# Patient Record
Sex: Female | Born: 1965 | Race: White | Hispanic: No | Marital: Married | Smoking: Never smoker
Health system: Southern US, Community
[De-identification: ages and names within clinical notes are randomized; demographics above are authoritative.]

## PROBLEM LIST (undated history)

## (undated) DIAGNOSIS — B009 Herpesviral infection, unspecified: Secondary | ICD-10-CM

## (undated) HISTORY — PX: HX NO SURGICAL PROCEDURES: 2100001501

## (undated) HISTORY — DX: Herpesviral infection, unspecified: B00.9

---

## 1985-08-26 ENCOUNTER — Inpatient Hospital Stay
Admission: AD | Admit: 1985-08-26 | Disposition: A | Discharge status: Home / Self Care | Payer: Self-pay | Source: Ambulatory Visit

## 1990-12-04 ENCOUNTER — Ambulatory Visit: Admit: 1990-12-04 | Disposition: A | Discharge status: Home / Self Care | Payer: Self-pay | Source: Ambulatory Visit

## 1990-12-07 ENCOUNTER — Inpatient Hospital Stay
Admission: AD | Admit: 1990-12-07 | Disposition: A | Discharge status: Home / Self Care | Payer: Self-pay | Source: Ambulatory Visit

## 2001-08-05 ENCOUNTER — Emergency Department
Admission: EM | Admit: 2001-08-05 | Disposition: A | Discharge status: Home / Self Care | Payer: Self-pay | Source: Ambulatory Visit

## 2007-02-18 ENCOUNTER — Ambulatory Visit: Payer: Self-pay

## 2008-04-19 ENCOUNTER — Ambulatory Visit: Admission: RE | Admit: 2008-04-19 | Payer: Self-pay | Source: Ambulatory Visit | Admitting: Medical

## 2009-03-29 ENCOUNTER — Encounter (INDEPENDENT_AMBULATORY_CARE_PROVIDER_SITE_OTHER): Payer: 59 | Admitting: Geriatric Medicine

## 2010-05-10 ENCOUNTER — Encounter (INDEPENDENT_AMBULATORY_CARE_PROVIDER_SITE_OTHER): Payer: Self-pay | Admitting: Geriatric Medicine

## 2010-05-10 ENCOUNTER — Ambulatory Visit (INDEPENDENT_AMBULATORY_CARE_PROVIDER_SITE_OTHER): Payer: 59 | Admitting: Geriatric Medicine

## 2010-05-10 MED ORDER — ACYCLOVIR 400 MG TABLET
400.0000 mg | ORAL_TABLET | Freq: Two times a day (BID) | ORAL | Status: DC
Start: 2010-05-10 — End: 2011-05-24

## 2010-05-10 NOTE — Patient Instructions (Signed)
Arterial Hypertension, High Blood Pressure   Arterial hypertension (high blood pressure) is a condition of elevated pressure in your blood vessels. Hypertension over a long period of time is a risk factor for strokes, heart attacks and heart failure. It is also the leading cause of kidney (renal) failure.   CAUSES   In Adults -- Over 90% of all hypertension has no known cause. This is called essential/primary hypertension. In the other 10% of people with hypertension, the increase in blood pressure is caused by another disorder. This is called secondary hypertension. IMPORTANT CAUSES OF SECONDARY HYPERTENSION ARE:   Heavy alcohol use.   Obstructive sleep apnea.   Hyperaldosterosim (Conn's syndrome).   Steroid use.   Chronic kidney failure.   Hyperparathyroidism.   Medications.  Renal artery stenosis.   Pheochromocytoma.   Cushing's disease.   Coarcataion of the aorta.   Scleroderma renal crisis.   Licorice (in excessive amounts).   Drugs (cocaine, methamphetamine).    Your caregiver can explain any items above that apply to you.   In Children -- Secondary hypertension is more common and should always be considered.   Pregnancy -- Few women of childbearing age have high blood pressure. However, up to 10% of them develop hypertension of pregnancy. Generally, this will not harm (benign) the woman. It may be a sign of 3 complications of pregnancy: pre-eclampsia, HELLP syndrome, and eclampsia. Follow-up and control with medication is necessary.   SYMPTOMS   This condition normally does not produce any noticeable symptoms. It is usually found during a routine exam.   Malignant hypertension is a late problem of high blood pressure. It may have the following symptoms:   Headaches.   Blurred vision.   End-organ damage (this means your kidneys, heart, lungs and other organs are being damaged).   Stressful situations can increase the blood pressure. If a person with normal blood pressure has their blood pressure go up while  being seen by their caregiver, this is often termed "white coat hypertension." Its importance is not known. It may be related with eventually developing hypertension or complications of hypertension.   Hypertension is often confused with mental tension, stress and anxiety.   DIAGNOSIS   The diagnosis is made by 3 separate blood pressure measurements. They are taken at least 1 week apart from each other. If there is organ damage from hypertension, the diagnosis may be made without repeat measurements.   Hypertension is usually identified by having blood pressure readings:   Above 140/90 mmHg measured in both arms, at 3 separate times, over a couple weeks.   Over 130/80 mmHg should be considered a risk factor and may require treatment in patients with diabetes.   Blood pressure readings over 120/80 mmHg are called "pre-hypertension" even in non-diabetic patients.   To get a true blood pressure measurement, use the following guidelines. Be aware of the factors that can alter blood pressure readings.   Take measurements at least 1 hour after caffeine.   Take measurements 30 minutes after smoking and without any stress. This is another reason to quit smoking - it raises your blood pressure.   Use a proper cuff size. Ask your caregiver if you are not sure about your cuff size.   Most home blood pressure cuffs are automatic. They will measure systolic and diastolic pressures. The systolic pressure is the pressure reading at the start of sounds. Diastolic pressure is the pressure at which the sounds disappear. If you are elderly, measure pressures in   multiple postures. Try sitting, lying or standing.   Sit at rest for a minimum of 5 minutes before taking measurements.   You should not be on any medications like decongestants. These are found in many cold medications.   Record your blood pressure readings and review them with your caregiver.   If you have hypertension:   Your caregiver may do tests to be sure you do not have  secondary hypertension (see "causes" above).   Your caregiver may also look for signs of the Metabolic Syndrome. This is also called Syndrome X or Insulin Resistance Syndrome. You may have this syndrome if you have type 2 diabetes, abdominal obesity, and abnormal blood lipids in addition to hypertension.   Your caregiver will take your medical and family history and perform a physical exam.   Diagnostic tests may include blood tests (for glucose, cholesterol, potassium, and kidney function), a urinalysis, or an EKG. Other tests may also be necessary depending on your condition.   PREVENTION   There are important lifestyle issues that you can adopt to reduce your chance of developing hypertension:   Maintain a normal weight.   Limit the amount of salt (sodium) in your diet.   Exercise often.   Limit alcohol intake.   Get enough potassium in your diet. Discuss specific advice with your caregiver.   Follow a DASH diet (dietary approaches to stop hypertension). This diet is rich in fruits, vegetables, and low-fat dairy products, and avoids certain fats.   PROGNOSIS   Essential hypertension cannot be cured. Lifestyle changes and medical treatment can lower blood pressure and reduce complications. The prognosis of secondary hypertension depends on the underlying cause. Many people whose hypertension is controlled with medicine or lifestyle changes can live a normal, healthy life.   RISKS AND COMPLICATIONS   While high blood pressure alone is not an illness, it often requires treatment due to its short- and long-term effects on many organs. Hypertension increases your risk for:   CVA's or strokes (Cerebrovascular accident).   Heart failure due to chronically high blood pressure (Hypertensive cardiomyopathy).  Heart attack (Myocardial infarction).   Damage to the retina (Hypertensive retinopathy).   Kidney failure (Hypertensive nephropathy).    Your caregiver can explain list items above that apply to you. Treatment of  hypertension can significantly reduce the risk of complications.   TREATMENT   For overweight patients, weight loss and regular exercise is recommended. Physical fitness lowers blood pressure.   Mild hypertension is usually treated with diet and exercise. A diet rich in fruits and vegetables, fat-free dairy and foods low in fat and salt (sodium) can help lower blood pressure. Decreasing salt intake decreases blood pressure in a 1/3 of people.   Stop smoking if you are a smoker.   The steps above are highly effective in reducing blood pressure. While these actions are easy to suggest, they are difficult to achieve. Most patients with moderate or severe hypertension end up requiring medications to bring their blood pressure down to a normal level. There are several classes of medications for treatment. Blood pressure pills (anti-hypertensives) will lower blood pressure by their different actions. Lowering the blood pressure by 10 mmHg may decrease the risk of complications by as much as 25%.   The goal of treatment is effective blood pressure control. This will reduce your risk for complications. Your caregiver will help you determine the best treatment for you according to your lifestyle. What is excellent treatment for one person, may not   be for you.   HOME CARE INSTRUCTIONS   DO NOT smoke.   Follow the lifestyle changes outlined in the "Prevention" section.   If you are on medications, follow the directions carefully. Blood pressure medications must be taken as prescribed. Skipping doses reduces their benefit. It also puts you at risk for problems.   Follow up with your caregiver, as directed.   If you are asked to monitor your blood pressure at home, follow the guidelines in the "Diagnosis" section above.   SEEK MEDICAL CARE IF:   You think you are having medication side effects.   You have recurrent headaches or lightheadedness.   You have swelling in your ankles.   You have trouble with your vision.   SEEK  IMMEDIATE MEDICAL CARE IF:   You have sudden onset of chest pain or pressure, difficulty breathing, or other symptoms of a heart attack.   You have a severe headache.   You have symptoms of a stroke (sudden weakness, difficulty speaking, difficulty walking).   MAKE SURE YOU:   Understand these instructions.   Will watch your condition.   Will get help right away if you are not doing well or get worse.   Information courtesy of the CDC & NIH.   Document Released: 01/15/2005 Document Re-Released: 12/29/2007   [employer] Patient Information 2011 [employer].

## 2010-05-10 NOTE — Progress Notes (Signed)
Subjective:     Patient ID:  Marie Gutierrez is an 45 y.o. female     Chief Complaint:  Chief Complaint   Patient presents with   . Medication Refill     acyclovir         The history is provided by the patient. No language interpreter was used.   she is here for med refill, she has hx of HSV tpe2. She takes Zovirax 400 mg BID as prophylaxy. She had only one episode of rash since last visit.  She never been Dxed with HTN. She denied exertional SOB/CP/dizziness.  Past Medical History   Diagnosis Date   . HSV-2 infection        Review of Systems   Constitutional: Negative.    HENT: Negative.    Respiratory: Negative.    Cardiovascular: Negative.    Genitourinary: Negative.    Neurological: Negative.        Objective:     Physical Exam   Nursing note and vitals reviewed.  Constitutional: She is oriented to person, place, and time and well-developed, well-nourished, and in no distress.   HENT:   Head: Normocephalic and atraumatic.   Neck: Normal range of motion. Neck supple. Carotid bruit is not present. No thyromegaly present.   Cardiovascular: Normal rate, normal heart sounds and intact distal pulses.    Pulmonary/Chest: Effort normal and breath sounds normal.   Abdominal: Soft. Bowel sounds are normal. She exhibits no distension and no mass. No tenderness. She has no rebound and no guarding.   Musculoskeletal: She exhibits no edema.   Lymphadenopathy:     She has no cervical adenopathy.   Neurological: She is alert and oriented to person, place, and time. Gait normal. GCS score is 15.         Ortho/Musculoskeletal:   She exhibits no edema.       Assessment & Plan:     1. 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 Elevated blood pressure (796.2S)  Send me home bp log  HTN hans out was given.

## 2010-09-07 ENCOUNTER — Encounter (INDEPENDENT_AMBULATORY_CARE_PROVIDER_SITE_OTHER): Payer: Self-pay | Admitting: Geriatric Medicine

## 2010-09-07 ENCOUNTER — Ambulatory Visit (INDEPENDENT_AMBULATORY_CARE_PROVIDER_SITE_OTHER): Payer: 59 | Admitting: Geriatric Medicine

## 2010-09-07 MED ORDER — OMEPRAZOLE 40 MG CAPSULE,DELAYED RELEASE
40.0000 mg | DELAYED_RELEASE_CAPSULE | Freq: Every day | ORAL | Status: DC
Start: 2010-09-07 — End: 2011-05-24

## 2010-09-07 MED ORDER — ALBUTEROL SULFATE HFA 90 MCG/ACTUATION AEROSOL INHALER
1.00 | INHALATION_SPRAY | Freq: Four times a day (QID) | RESPIRATORY_TRACT | Status: DC | PRN
Start: 2010-09-07 — End: 2011-05-24

## 2010-09-07 NOTE — Progress Notes (Signed)
Subjective:     Patient ID:  Marie Gutierrez is an 45 y.o. female     Chief Complaint:    Chief Complaint   Patient presents with   . Other     pt states pressure in chest and throat, pressure leaves if she burps x 2 weeks   . Dizziness     today, 1 episode was talking on phone and became dizzy last 20 seconds or so       Patient is a 45 y.o. female presenting with chest pain. The history is provided by the patient. No language interpreter was used.   Chest Pain   This is a new problem. Episode onset: 2 weeks. The onset quality is gradual. The problem occurs constantly. The problem has been unchanged. The pain is present in the substernal region. The pain is mild. The quality of the pain is described as pressure. The pain does not radiate. Associated symptoms include dizziness (just one short episode for 20 sec today, not dizzy now.). Pertinent negatives include no abdominal pain, back pain, claudication, cough, diaphoresis, exertional chest pressure, fever, headaches, hemoptysis, irregular heartbeat, leg pain, lower extremity edema, malaise/fatigue, nausea, near-syncope, numbness, orthopnea, palpitations, PND, shortness of breath, sputum production, syncope, vomiting or weakness. The pain is aggravated by nothing. Treatments tried: sprites. The treatment provided moderate relief.   Pertinent negatives for past medical history include no seizures.     Past Medical History   Diagnosis Date   . HSV-2 infection      History reviewed. No pertinent past surgical history.      Review of Systems   : 1   Constitutional: Negative for fever, malaise/fatigue and diaphoresis.   HENT: Negative.  Negative for hearing loss, ear pain, congestion, sore throat, tinnitus and ear discharge.    Eyes: Negative.    Respiratory: Negative for cough, hemoptysis, sputum production and shortness of breath.    Cardiovascular: Positive for chest pain. Negative for palpitations, orthopnea, claudication, syncope, PND and near-syncope.    Gastrointestinal: Negative for nausea, vomiting and abdominal pain.   Musculoskeletal: Negative for back pain.   Neurological: Positive for dizziness (just one short episode for 20 sec today, not dizzy now.). Negative for tingling, tremors, sensory change, speech change, focal weakness, seizures, loss of consciousness, weakness, numbness and headaches.       Objective:     Physical Exam   [nursing notereviewed.  Constitutional: She is oriented to person, place, and time and well-developed, well-nourished, and in no distress.   HENT:   Head: Normocephalic and atraumatic.   Eyes: Conjunctivae and EOM are normal. Pupils are equal, round, and reactive to light.   Neck: Normal range of motion. Neck supple. Carotid bruit is not present. No thyromegaly present.   Cardiovascular: Normal rate, regular rhythm, normal heart sounds and intact distal pulses.    Pulmonary/Chest: Effort normal. She has wheezes.   Abdominal: Soft. Bowel sounds are normal. She exhibits no distension and no mass. There is no tenderness. There is no rebound and no guarding.   Lymphadenopathy:     She has no cervical adenopathy.   Neurological: She is alert and oriented to person, place, and time. She has normal motor skills, normal sensation, normal strength, normal reflexes and intact cranial nerves. She has a normal Romberg Test. Gait normal. GCS score is 15.       .    Assessment & Plan:     1. Atypical chest pain (786.59)  2. Heartburn (787.1)  Omeprazole (PRILOSEC) 40 mg Oral Capsule, Delayed Release(E.C.)   3. Wheeze (786.07)  albuterol sulfate (PROVENTIL OR VENTOLIN) 90 mcg/actuation Inhalation HFA Aerosol Inhaler

## 2010-12-19 ENCOUNTER — Encounter (INDEPENDENT_AMBULATORY_CARE_PROVIDER_SITE_OTHER): Payer: 59 | Admitting: Geriatric Medicine

## 2011-05-24 ENCOUNTER — Encounter (INDEPENDENT_AMBULATORY_CARE_PROVIDER_SITE_OTHER): Payer: Self-pay | Admitting: Geriatric Medicine

## 2011-05-24 ENCOUNTER — Ambulatory Visit (INDEPENDENT_AMBULATORY_CARE_PROVIDER_SITE_OTHER): Payer: 59 | Admitting: Geriatric Medicine

## 2011-05-24 VITALS — BP 110/82 | HR 74 | Temp 98.4°F | Resp 12 | Ht 62.0 in | Wt 127.0 lb

## 2011-05-24 MED ORDER — ACYCLOVIR 400 MG TABLET
400.0000 mg | ORAL_TABLET | Freq: Two times a day (BID) | ORAL | Status: DC
Start: 2011-05-24 — End: 2012-04-18

## 2011-05-24 NOTE — Progress Notes (Signed)
 Subjective:     Patient ID:  Marie Gutierrez is an 46 y.o. female     Chief Complaint:    Chief Complaint   Patient presents with   . Medication Refill     no complaints       The history is provided by the patient. No language interpreter was used.     she is here for med refill, she has hx of HSV tpe2. She takes Zovirax  400 mg BID as prophylaxy. She hadno episode of rash since last visit.    She denied exertional SOB/CP/dizziness.  No Known Allergies      Review of Systems   Constitutional: Negative.    HENT: Negative.    Cardiovascular: Negative.    Gastrointestinal: Negative.    Skin: Negative.    Neurological: Negative.        Objective:     Filed Vitals:    05/24/11 1741 05/24/11 1802   BP: 128/89 110/82   Pulse: 74    Temp: 36.9 C (98.4 F)    TempSrc: Oral    Resp: 12    Height: 1.575 m (5' 2)    Weight: 57.607 kg (127 lb)    SpO2: 100%        Physical Exam   Constitutional: She appears well-developed and well-nourished.   HENT:   Head: Normocephalic and atraumatic.   Neck: Normal range of motion. Neck supple. No thyromegaly present.   Cardiovascular: Normal rate, regular rhythm, normal heart sounds and intact distal pulses.    Pulm:  Effort normal and breath sounds normal.    Abdomen:   Bowel sounds are normal. She exhibits no distension and no mass. Soft. No tenderness. She has no rebound and no guarding.   Ortho/Musculoskeletal:   She exhibits no edema.   Lymphadenopathy:     She has no cervical adenopathy.   Nursing note and vitals reviewed.      .    Assessment & Plan:     1. HSV-2 infection (054.9)  acyclovir  (ZOVIRAX ) 400 mg Oral Tablet   RTC in one year

## 2012-04-18 ENCOUNTER — Ambulatory Visit: Payer: 59 | Admitting: Geriatric Medicine

## 2012-04-18 ENCOUNTER — Encounter: Payer: Self-pay | Admitting: Geriatric Medicine

## 2012-04-18 VITALS — BP 118/68 | HR 88 | Temp 98.7°F | Resp 12 | Ht 63.0 in | Wt 129.0 lb

## 2012-04-18 MED ORDER — ACYCLOVIR 400 MG TABLET
400.0000 mg | ORAL_TABLET | Freq: Two times a day (BID) | ORAL | Status: DC
Start: 2012-04-18 — End: 2012-07-13

## 2012-04-18 NOTE — Progress Notes (Signed)
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

## 2012-04-18 NOTE — Progress Notes (Signed)
Subjective:     Patient ID:  Marie Gutierrez is an 47 y.o. female     Chief Complaint:    Chief Complaint   Patient presents with   . Medication Refill     acyclovir        The history is provided by the patient. No language interpreter was used.   she is here for med refill , she has hx of HSVtype 2.  she is here for med refill, she has hx of HSV tpe2. She takes Zovirax 400 mg BID as prophylaxy. She hadno episode of rash since last visit.   She denied exertional SOB/CP/dizziness.  She would like to be checked for HPL and DM.  No Known Allergies   Past Medical History   Diagnosis Date   . HSV-2 infection      History reviewed. No pertinent past surgical history.  Family History   Problem Relation Age of Onset   . Diabetes General Family Hx      History     Social History   . Marital Status: Divorced     Spouse Name: N/A     Number of Children: N/A   . Years of Education: N/A     Social History Main Topics   . Smoking status: Never Smoker    . Smokeless tobacco: Never Used   . Alcohol Use: No   . Drug Use: Not on file   . Sexually Active: Not on file     Other Topics Concern   . Not on file     Social History Narrative   . No narrative on file     Expanded Substance History     Additional history       ROS  Constitutional: Negative.   HENT: Negative.   Cardiovascular: Negative.   Gastrointestinal: Negative.   Skin: Negative.   Neurological: Negative.  Objective:   BP 118/68   Pulse 88   Temp(Src) 37.1 C (98.7 F) (Tympanic)   Resp 12   Ht 1.6 m (5\' 3" )   Wt 58.514 kg (129 lb)   BMI 22.86 kg/m2   SpO2 99%   LMP 02/13/2012    Physical Exam   Constitutional: She appears well-developed and well-nourished.   HENT:   Head: Normocephalic and atraumatic.   Neck: Normal range of motion. Neck supple. No thyromegaly present.   Cardiovascular: Normal rate, regular rhythm and normal heart sounds.    Pulm:  Effort normal and breath sounds normal.    Abdomen:    Bowel sounds are normal. She exhibits no distension. Soft. No tenderness. She has no rebound and no guarding.   Ortho/Musculoskeletal:   She exhibits no edema.   Nursing note and vitals reviewed.      .    Assessment & Plan:       ICD-9-CM    1. HSV-2 infection 054.9 acyclovir (ZOVIRAX) 400 mg Oral Tablet  Stable as long as taling Zovirax   2. Screening for lipoid disorders V77.91 LIPID PANEL        3. Screening for diabetes mellitus V77.1 GLUCOSE - [facility] ONLY   4. Laboratory examination ordered as part of a routine general medical examination V72.62 LIPID PANEL     GLUCOSE - [facility] ONLY        5.      6. Encounter for long-term (current) use of other medications V58.69

## 2012-04-20 ENCOUNTER — Encounter: Payer: Self-pay | Admitting: Geriatric Medicine

## 2012-06-12 ENCOUNTER — Encounter: Payer: Self-pay | Admitting: Geriatric Medicine

## 2012-06-12 ENCOUNTER — Ambulatory Visit: Payer: 59 | Admitting: Geriatric Medicine

## 2012-06-12 VITALS — BP 115/70 | HR 101 | Temp 98.0°F | Resp 14 | Ht 63.0 in | Wt 121.0 lb

## 2012-06-12 MED ORDER — BISMUTH SUBSALICYLATE 262 MG/15 ML ORAL SUSPENSION
30.00 mL | Freq: Four times a day (QID) | ORAL | Status: DC | PRN
Start: 2012-06-12 — End: 2012-07-13

## 2012-06-12 MED ORDER — ONDANSETRON HCL 4 MG TABLET
4.00 mg | ORAL_TABLET | Freq: Three times a day (TID) | ORAL | Status: DC | PRN
Start: 2012-06-12 — End: 2012-07-13

## 2012-06-12 NOTE — Progress Notes (Signed)
Subjective:     Patient ID:  Marie Gutierrez is an 47 y.o. female     Chief Complaint:    Chief Complaint   Patient presents with   . Ear Pain       Patient is a 47 y.o. female presenting with ear pain. The history is provided by the patient.   Ear Pain   There is pain in the right ear. This is a new problem. The current episode started yesterday. The problem occurs constantly. The problem has been gradually worsening. Maximum temperature: subjective fever. The pain is at a severity of 4/10. Associated symptoms include abdominal pain and headaches. Pertinent negatives include no coughing, diarrhea (resolved), drainage, ear discharge, hearing loss, neck pain, rash, rhinorrhea, sore throat or vomiting (resolved). Associated symptoms comments: Started out with N/V/D non bloody 3-4 days ago, now has resolved, but rt ear and face pain started yesterday.  . She has tried nothing for the symptoms. There is no history of a chronic ear infection, hearing loss or a tympanostomy tube.       Past Medical History  Current Outpatient Prescriptions   Medication Sig   . acyclovir (ZOVIRAX) 400 mg Oral Tablet Take 1 Tab (400 mg total) by mouth Twice daily     No Known Allergies  Past Medical History   Diagnosis Date   . HSV-2 infection      No past surgical history on file.  Family History   Problem Relation Age of Onset   . Diabetes       History     Social History   . Marital Status: Divorced     Spouse Name: N/A     Number of Children: N/A   . Years of Education: N/A     Social History Main Topics   . Smoking status: Never Smoker    . Smokeless tobacco: Never Used   . Alcohol Use: No   . Drug Use: Not on file   . Sexually Active: Not on file     Other Topics Concern   . Not on file     Social History Narrative   . No narrative on file         Review of Systems   Constitutional: Positive for fever, weight loss (6 LBs over past few days) and malaise/fatigue.    HENT: Positive for ear pain. Negative for hearing loss, nosebleeds, congestion, sore throat, rhinorrhea, neck pain and ear discharge.    Respiratory: Negative.  Negative for cough and stridor.    Cardiovascular: Negative.    Gastrointestinal: Positive for nausea and abdominal pain. Negative for vomiting (resolved) and diarrhea (resolved).   Skin: Negative for rash.   Neurological: Positive for headaches.       Objective:     BP 115/70   Pulse 101   Temp(Src) 36.7 C (98 F) (Tympanic)   Resp 14   Ht 1.6 m (5\' 3" )   Wt 54.885 kg (121 lb)   BMI 21.44 kg/m2   SpO2 98%   LMP 04/18/2012     Physical Exam   Constitutional: She is oriented to person, place, and time. She appears well-developed and well-nourished.   HENT:   Head: Normocephalic and atraumatic.   Right Ear: Hearing, tympanic membrane, external ear and ear canal normal.   Left Ear: Hearing, tympanic membrane, external ear and ear canal normal.   Nose: Nose normal.   Mouth/Throat: Uvula is midline. Mucous membranes are dry. No oropharyngeal exudate, posterior  oropharyngeal edema or posterior oropharyngeal erythema.   Neck: Normal range of motion. Neck supple. No thyromegaly present.   Pulm:  Effort normal.    Ortho/Musculoskeletal:   She exhibits no edema.   Lymphadenopathy:        Head (right side): No submental, no submandibular, no tonsillar, no preauricular, no posterior auricular and no occipital adenopathy present.        Head (left side): No submental, no submandibular, no tonsillar, no preauricular, no posterior auricular and no occipital adenopathy present.     She has no cervical adenopathy.   Neurological: She is alert and oriented to person, place, and time.   Nursing note and vitals reviewed.        .    Assessment & Plan:       ICD-9-CM    1. Right ear pain 388.70 RETURN TO WORK/SCHOOL  Tylenol PRN   2. Viral syndrome 079.99 ondansetron (ZOFRAN) 4 mg Oral Tablet     bismuth subsalicylate (PEPTO-BISMOL) 262 mg/15 mL Oral Suspension    The 'BRAT' diet is suggested, then progress to diet as tolerated as symptoms abate. Call if bloody stools, persistent diarrhea, vomiting, fever or abdominal pain.  Push fluid

## 2012-06-12 NOTE — Patient Instructions (Signed)
The 'BRAT' diet is suggested, then progress to diet as tolerated as symptoms abate. Call if bloody stools, persistent diarrhea, vomiting, fever or abdominal pain.

## 2012-07-13 ENCOUNTER — Encounter (INDEPENDENT_AMBULATORY_CARE_PROVIDER_SITE_OTHER): Payer: Self-pay | Admitting: Family Medicine

## 2012-07-13 ENCOUNTER — Ambulatory Visit (INDEPENDENT_AMBULATORY_CARE_PROVIDER_SITE_OTHER): Payer: 59 | Admitting: Family Medicine

## 2012-07-13 VITALS — BP 137/84 | HR 132 | Temp 101.6°F | Resp 16 | Ht 63.6 in | Wt 121.4 lb

## 2012-07-13 DIAGNOSIS — A084 Viral intestinal infection, unspecified: Secondary | ICD-10-CM

## 2012-07-13 DIAGNOSIS — R509 Fever, unspecified: Secondary | ICD-10-CM

## 2012-07-13 MED ORDER — ONDANSETRON 4 MG DISINTEGRATING TABLET
4.0000 mg | ORAL_TABLET | Freq: Once | ORAL | Status: AC
Start: 2012-07-13 — End: 2012-07-13

## 2012-07-13 NOTE — Progress Notes (Deleted)
Subjective:     Marie Gutierrez is a 47 y.o. female here for fevers, vomiting.        Review of Systems: All others negative    Objective:     Vitals: BP 137/84   Pulse 132   Temp(Src) 38.7 C (101.6 F) (Oral)   Resp 16   Ht 1.615 m (5' 3.6")   Wt 55.067 kg (121 lb 6.4 oz)   BMI 21.11 kg/m2  General: {GENERAL :20944}  Lungs: {Lung:20948}  Cardiovascular: {Cardiovascular:20950}  Abdomen: {Gastrointestinal:20954}  Extremities: {Extremities:20958}      Assessment/Plan:   No diagnosis found.    follow up ***        Other medical issues include:  Patient Active Problem List   Diagnosis   . History of herpes genitalis   . Viral syndrome       Family History   Problem Relation Age of Onset   . Hypertension Father      No Known Allergies  Outpatient Prescriptions Marked as Taking for the 07/13/12 encounter (Office Visit) with Marcie Mowers, MD   Medication Sig   . acyclovir (ZOVIRAX) 200 mg Oral Capsule Take 200 mg by mouth Five times a day       Burna Cash, MD 07/13/2012, 6:04 PM

## 2012-07-13 NOTE — Progress Notes (Signed)
BP 137/84   Pulse 132   Temp(Src) 38.7 C (101.6 F) (Oral)   Resp 16   Ht 1.615 m (5' 3.6")   Wt 55.067 kg (121 lb 6.4 oz)   BMI 21.11 kg/m2  Marcy Siren, RTR 07/13/2012, 5:55 PM

## 2012-07-13 NOTE — Progress Notes (Addendum)
Subjective:     Marie Gutierrez is a 47 y.o. female here for vomiting.  Yesterday morning she started to feel nauseous and chilled, and last night started vomiting.  Has vomited several times since then and hasn't really been able to keep any food or liquids down.  Also today is feeling like she has a fever.  No diarrhea or abdominal pain.  No blood in vomit.  Was feeling well until yesterday but had a similar illness about a month ago.      Review of Systems: All others negative    Objective:     Vitals: BP 137/84   Pulse 132   Temp(Src) 38.7 C (101.6 F) (Oral)   Resp 16   Ht 1.615 m (5' 3.6")   Wt 55.067 kg (121 lb 6.4 oz)   BMI 21.11 kg/m2  General: appears mildly to moderately ill but not in distress  HEENT: MMM at this time and pink, TMs clear, no adenopathy  Lungs: clear to auscultation bilaterally.   Cardiovascular:    Heart tachycardic, no murmur  Abdomen: soft, non-tender, bowel sounds normal, non-distended and no hepatosplenomegaly  Extremities: no cyanosis or edema      Assessment/Plan:       ICD-9-CM   1. Viral gastroenteritis 008.8   2. Fever and chills 780.60   tried Zofran odt here and did well with it so called in Rx to [facility]  Encouraged to push fluids and to report to ED if abdominal pain develops or she feels worse in any way, or if she can't hold down fluids since she is febrile and tachycardic    follow up with PCP this week        Other medical issues include:  Patient Active Problem List   Diagnosis   . History of herpes genitalis   . Viral syndrome       Family History   Problem Relation Age of Onset   . Hypertension Father      No Known Allergies  Outpatient Prescriptions Marked as Taking for the 07/13/12 encounter (Office Visit) with Marcie Mowers, MD   Medication Sig   . acyclovir (ZOVIRAX) 200 mg Oral Capsule Take 200 mg by mouth Five times a day       Burna Cash, MD 07/13/2012, 6:02 PM       I discussed the patient with the resident.  I reviewed the resident's note.  I agree with the findings and plan of care as documented in the resident's note.  Any exceptions/additions are edited/noted.    Marcie Mowers, MD 07/14/2012, 3:19 PM

## 2012-07-13 NOTE — Progress Notes (Signed)
07/13/12 1800   Nursing Procedures / Med Admin   Medications- other Zofran 4mg  ODT   Medication Dose 4mg    Route of Administration SL   Time of Administration 1820   [facility] # [id number]   LOT # [id number]   Expiration date 03/29/13   Manufacturer [employer]   Clinic Supplied Yes   Comments: id and allergies confirmed prior to admin   Initials tlh

## 2012-07-14 ENCOUNTER — Encounter (INDEPENDENT_AMBULATORY_CARE_PROVIDER_SITE_OTHER): Payer: Self-pay

## 2012-07-14 NOTE — Progress Notes (Unsigned)
Spoke to daugther, pt feeling better, told to have pt call us for any questions or concerns.

## 2012-07-18 ENCOUNTER — Encounter: Payer: Self-pay | Admitting: Geriatric Medicine

## 2012-07-18 ENCOUNTER — Ambulatory Visit: Payer: 59 | Admitting: Geriatric Medicine

## 2012-07-18 VITALS — BP 110/68 | HR 107 | Temp 98.0°F | Resp 14 | Ht 63.0 in | Wt 122.0 lb

## 2012-07-18 DIAGNOSIS — N3090 Cystitis, unspecified without hematuria: Secondary | ICD-10-CM | POA: Insufficient documentation

## 2012-07-18 LAB — POCT URINE DIPSTICK
BILIRUBIN: NEGATIVE
GLUCOSE: NEGATIVE
KETONE: NEGATIVE
NITRITE: NEGATIVE
PH: 5
PROTEIN: NEGATIVE
SPECIFIC GRAVITY: 5
UROBILINOGEN: NEGATIVE

## 2012-07-18 MED ORDER — SULFAMETHOXAZOLE 800 MG-TRIMETHOPRIM 160 MG TABLET
1.00 | ORAL_TABLET | Freq: Two times a day (BID) | ORAL | Status: DC
Start: 2012-07-18 — End: 2013-01-16

## 2012-07-18 NOTE — Progress Notes (Signed)
Patient is here for a UTI, frequency in urination, small blood on toilet paper Bartholome Bill, CMA

## 2012-07-18 NOTE — Progress Notes (Signed)
Subjective:     Patient ID:  Marie Gutierrez is an 47 y.o. female     Chief Complaint:    Chief Complaint   Patient presents with   . Urinary Tract Infection       Patient is a 47 y.o. female presenting with frequency.   Urinary Frequency  This is a new problem. Episode onset: 5-6 days ago, with viral symptoms with nausea and myalgia, she went to Urgent care, Zofran was given, she did not mentioned them about  frequency. The problem occurs constantly. The problem has been gradually worsening. Associated symptoms comments: She c/o urgency and lower abd pressure.she feels tired with body ache.. Nothing aggravates the symptoms. Nothing relieves the symptoms. She has tried acetaminophen for the symptoms. The treatment provided no relief.     Last UTI was 2-3 years ago  Past Medical History  Current Outpatient Prescriptions   Medication Sig   . acyclovir (ZOVIRAX) 200 mg Oral Capsule Take 200 mg by mouth Five times a day     No Known Allergies  Past Medical History   Diagnosis Date   . HSV-2 infection      Past Surgical History   Procedure Laterality Date   . Hx no surgical procedures       Family History   Problem Relation Age of Onset   . Hypertension Father      History     Social History   . Marital Status: Divorced     Spouse Name: N/A     Number of Children: N/A   . Years of Education: N/A     Occupational History   .       Social History Main Topics   . Smoking status: Never Smoker    . Smokeless tobacco: Never Used   . Alcohol Use: No   . Drug Use: Not on file   . Sexually Active: Not on file     Other Topics Concern   . Not on file     Social History Narrative   . No narrative on file         Review of Systems   Constitutional: Positive for malaise/fatigue.   Respiratory: Negative.    Cardiovascular: Negative.    Gastrointestinal: Positive for nausea.   Genitourinary: Positive for urgency and frequency. Negative for dysuria.   Musculoskeletal: Positive for myalgias.       Objective:      BP 110/68   Pulse 107   Temp(Src) 36.7 C (98 F) (Tympanic)   Resp 14   Ht 1.6 m (5\' 3" )   Wt 55.339 kg (122 lb)   BMI 21.62 kg/m2   SpO2 98%   LMP 04/12/2012     Physical Exam   Constitutional: She appears well-nourished.   HENT:   Head: Normocephalic and atraumatic.   Right Ear: External ear normal.   Left Ear: External ear normal.   Pulm:  Effort normal and breath sounds normal.    Abdomen:   Normal appearance and bowel sounds are normal. Tenderness is present in the suprapubic area. She has no rigidity, no rebound and no guarding.   GU:    No CVA tenderness present.    Ortho/Musculoskeletal:   She exhibits no edema.     .    Assessment & Plan:       ICD-9-CM    1. Frequency of urination 788.41 POCT URINE DIPSTICK: abnormal see EPIC   2. Cystitis 595.9 trimethoprim-sulfamethoxazole (BACTRIM DS)  800-160 mg Oral Tablet    Post-coital micturation strongly advised,always wipe back wards and keep vaginal area clean.  Drink plenty of fluids- especially cranberry juice.

## 2012-07-18 NOTE — Patient Instructions (Signed)
Post-coital micturation strongly advised,always wipe back wards and keep vaginal area clean.  Drink plenty of fluids- especially cranberry juice.

## 2012-07-18 NOTE — Progress Notes (Signed)
07/18/12 0900   Urine   Time collected 0945   Glucose Negative   Bilirubin Negative   Ketones Negative   Specific Gravity 1.025   Blood (urine) Large (3+)   pH 5.0   Protein Negative   Urobilinogen Normal    Nitrite Negative   Leukocytes ! 3+   Bartholome Bill, CMA

## 2012-11-22 ENCOUNTER — Other Ambulatory Visit: Payer: Self-pay

## 2012-11-24 ENCOUNTER — Other Ambulatory Visit: Payer: Self-pay

## 2013-01-16 ENCOUNTER — Encounter (INDEPENDENT_AMBULATORY_CARE_PROVIDER_SITE_OTHER): Payer: Self-pay | Admitting: Emergency Medical Services

## 2013-01-16 ENCOUNTER — Ambulatory Visit (INDEPENDENT_AMBULATORY_CARE_PROVIDER_SITE_OTHER): Payer: 59 | Admitting: Emergency Medical Services

## 2013-01-16 MED ORDER — AZITHROMYCIN 250 MG TABLET
ORAL_TABLET | ORAL | Status: DC
Start: 2013-01-16 — End: 2013-02-27

## 2013-01-16 MED ORDER — ONDANSETRON 4 MG DISINTEGRATING TABLET
4.00 mg | ORAL_TABLET | Freq: Two times a day (BID) | ORAL | Status: AC | PRN
Start: 2013-01-16 — End: 2013-01-18

## 2013-01-16 NOTE — Patient Instructions (Signed)
 [facility]  [address]  [place], [place] [address]  Phone: [phone])  Fax: [phone]               Open Daily 12:00pm - 8:00pm ~ Closed Thanksgiving and Christmas Day     Attending Caregiver: Alvina Bathe, MD      Today's orders:   Orders Placed This Encounter   . POCT RAPID FLU   . ondansetron  (ZOFRAN  ODT) 4 mg Oral Tablet, Rapid Dissolve        Prescription(s) E-Rx to:  [id number] - [place], [place] - [address]    ________________________________________________________________________  Short Term Disability and Family Medical Leave Act  [facility] does NOT provide assistance with any disability applications.  If you feel your medical condition requires you to be on disability, you will need to follow up with  Your primary care physician or a specialist.  We apologize for any inconvenience.    For Medication Prescribed by [facility]:  As an Urgent Care facility, our clinic does NOT offer prescription refills over the telephone.    If you need more of the medication one of our medical providers prescribed, you will  Either need to be re-evaluated by us  or see your primary care physician.    ________________________________________________________________________      It is very important that we have a phone number that is the single best way to contact you in the event that we become aware of important clinical information or concerns after your discharge.  If the phone number you provided at registration is NOT this number you should inform staff and registration prior to leaving.      Your treatment and evaluation today was focused on identifying and treating potentially emergent conditions based on your presenting signs, symptoms, and history.  The resulting initial clinical impression and treatment plan is not intended to be definitive or a substitute for a full physical examination and evaluation by your primary care provider.  If your symptoms persist, worsen, or  you develop any new or concerning symptoms, you need to be evaluated.      If you received x-rays during your visit, be aware that the final and formal interpretation of those films by a radiologist may occur after your discharge.  If there is a significant discrepancy identified after your discharge, we will contact you at the telephone number provided at registration.      If you received a pelvic exam, you may have cultures pending for sexually transmitted diseases.  Positive cultures are reported to the [facility] Department of Health as required by state law.  You should be contacted if you cultures are positive.  We will not contact you if they are negative.  You did NOT receive a PAP smear (the screening test for cervical).  This specific test for women is best performed by your gynecologist or primary care provider when indicated.      If you are over 75 year old, we cannot discuss your personal health information with a parent, spouse, family member, or anyone else without your express consent.  This does not include those who have legitimate access to your records and information to assist in your care under the provisions of HIPAA [facility] Portability and Accountability Act) law, or those to whom you have previously given express written consent to do so, such a legal guardian or Power of [place].      You may have received  medication that may cause you to feel drowsy and/or light headed for several hours.  You may even experience some amnesia of your stay.  You should avoid operating a motor vehicle or performing any activity requiring complete alertness or coordination until you feel fully awake (approximately 24-48 hours).  Avoid alcoholic beverages.  You may also have a dry mouth for several hours.  This is a normal side effect and will disappear as the effects of the medication wear off.      Instructions discussed with patient upon discharge by clinical staff with all questions answered.  Please  call [facility] [phone]) if any further questions.  Go immediately to the emergency department if any concern or worsening symptoms.      Alvina Bathe, MD 01/16/2013, 7:11 PM          Sinusitis  Sinusitis is redness, soreness, and puffiness (inflammation) of the air pockets in the bones of your face (sinuses). The redness, soreness, and puffiness can cause air and mucus to get trapped in your sinuses. This can allow germs to grow and cause an infection.   HOME CARE    Drink enough fluids to keep your pee (urine) clear or pale yellow.   Use a humidifier in your home.   Run a hot shower to create steam in the bathroom. Sit in the bathroom with the door closed. Breathe in the steam 3 4 times a day.   Put a warm, moist washcloth on your face 3 4 times a day, or as told by your doctor.   Use salt water sprays (saline sprays) to wet the thick fluid in your nose. This can help the sinuses drain.   Only take medicine as told by your doctor.  GET HELP RIGHT AWAY IF:    Your pain gets worse.   You have very bad headaches.   You are sick to your stomach (nauseous).   You throw up (vomit).   You are very sleepy (drowsy) all the time.   Your face is puffy (swollen).   Your vision changes.   You have a stiff neck.   You have trouble breathing.  MAKE SURE YOU:    Understand these instructions.   Will watch your condition.   Will get help right away if you are not doing well or get worse.  Document Released: 07/04/2007 Document Revised: 10/10/2011 Document Reviewed: 08/21/2011  [facility] Patient Information 2014 [place], [place].  Clostridium Difficile Infection  Clostridium difficile (C. difficile) is a germ found in the intestines. C. difficile infection can occur after taking some medicines. C. difficile infection can cause watery poop (diarrhea) or severe disease.  HOME CARE    Drink enough fluids to keep your pee (urine) clear or pale yellow. Avoid milk, caffeine, and alcohol.   Ask your doctor how  to replace body fluid losses (rehydrate).   Eat small meals more often rather than large meals.   Take your medicine (antibiotics) as told. Finish it even if you start to feel better.   Do not  use medicines to slow the watery poop.   Wash your hands well after using the bathroom and before preparing food.   Make sure people who live with you wash their hands often.   Clean all surfaces. Use a product that contains chlorine bleach.  GET HELP RIGHT AWAY IF:    The watery poop does not stop, or it comes back after you finish your medicine.   You feel very dry  or thirsty (dehydrated).   You have a fever.   You have more belly (abdominal) pain or tenderness.   There is blood in your poop (stool), or your poop is black and tar-like.   You cannot eat food or drink liquids without throwing up (vomiting).  MAKE SURE YOU:    Understand these instructions.   Will watch your condition.   Will get help right away if you are not doing well or get worse.  Document Released: 11/12/2008 Document Revised: 05/12/2012 Document Reviewed: 06/23/2010  [facility] Patient Information 2014 [place], [place].  Nausea, Adult  Nausea means you feel sick to your stomach or need to throw up (vomit). It may be a sign of a more serious problem. If nausea gets worse, you may throw up. If you throw up a lot, you may lose too much body fluid (dehydration).  HOME CARE    Get plenty of rest.   Ask your doctor how to replace body fluid losses (rehydrate).   Eat small amounts of food. Sip liquids more often.   Take all medicines as told by your doctor.  GET HELP RIGHT AWAY IF:   You have a fever.   You pass out (faint).   You keep throwing up or have blood in your throw up.   You are very weak, have dry lips or a dry mouth, or you are very thirsty (dehydrated).   You have dark or bloody poop (stool).   You have very bad chest or belly (abdominal) pain.   You do not get better after 2 days, or you get worse.   You have a  headache.  MAKE SURE YOU:   Understand these instructions.   Will watch your condition.   Will get help right away if you are not doing well or get worse.  Document Released: 01/04/2011 Document Revised: 04/09/2011 Document Reviewed: 01/04/2011  [facility] Patient Information 2014 [place], [place].

## 2013-01-16 NOTE — Progress Notes (Signed)
 01/16/13 1828   Rapid Influ A   Rapid Influenza A Negative   Rapid Influenza Culture Sent  NO   Initials JK   Lot# [id number]   Expiration Date 02/27/14   Internal Control Valid yes   Rapid Influ B   Rapid Influenza B Negative   Rapid Influenza Culture Sent  NO   Initials JK   Lot# [id number]   Expiration Date 02/27/14   Internal Control Valid yes

## 2013-01-16 NOTE — Progress Notes (Signed)
 BP 143/89  Pulse 110  Temp(Src) 38.3 C (100.9 F) (Oral)  Resp 18  Ht 1.575 m (5' 2)  Wt 58.968 kg (130 lb)  BMI 23.77 kg/m2  LMP 04/12/2012  Melanie CHRISTELLA Croft, RN 01/16/2013, 6:08 PM

## 2013-01-16 NOTE — Progress Notes (Signed)
Subjective:            47 year old female presents to urgent care with a one day history of nasal drainage, sinus pain, and body aches. She denies any shortness of breath or wheezing. She denies any ill contacts. She admits to having some nausea. She denies any vomiting or diarrhea. She has taken Tylenol with some relief of her symptoms.     Current Outpatient Prescriptions   Medication Sig    acyclovir (ZOVIRAX) 200 mg Oral Capsule Take 200 mg by mouth Five times a day                 Family History   Problem Relation Age of Onset    Hypertension Father        Active Ambulatory Problems     Diagnosis Date Noted    History of herpes genitalis 04/20/2012    Viral syndrome 06/12/2012    Cystitis 07/18/2012     Resolved Ambulatory Problems     Diagnosis Date Noted    No Resolved Ambulatory Problems     Past Medical History   Diagnosis Date    HSV-2 infection        No Known Allergies    Objective:  Vitals Signs are stable   Filed Vitals:    01/16/13 1807   BP: 143/89   Pulse: 110   Temp: 38.3 C (100.9 F)   TempSrc: Oral   Resp: 18   Height: 1.575 m (5\' 2" )   Weight: 58.968 kg (130 lb)     Gen: no acute distress, non toxic looking  HEENT:  NCAT, PERRL, TMs WNL, frontal and maxillary sinuses TTP.  Heart:  Regular without murmur  Lungs:  Clear without wheeze  Abd:  Soft, nt/nd, positive bowel sounds present  Extremities: No edema       Labs:  Rapid Influenza - Negative       Assessment:               ICD-9-CM    1. Flu-like symptoms 487.1 POCT RAPID FLU   2. Acute maxillary sinusitis 461.0    3. Acute frontal sinusitis 461.1    4. Nausea 787.02          Plan:        Orders Placed This Encounter    POCT RAPID FLU    ondansetron (ZOFRAN ODT) 4 mg Oral Tablet, Rapid Dissolve    azithromycin (ZITHROMAX) 250 mg Oral Tablet              Physician advised patient that azithromycin has been known to cause heart arrhythmias in some patients. Patient informed and wishes to proceed with azithromycin use today.  Physician advised patient to take full course of medicine, watch for diarrhea (C. Difficile discussed with patient) and signs of allergic reaction, ie, swelling, rash, difficulty breathing or swallowing.  Physician recommended ingestion of yogurt or probiotics while taking antibiotics.    Instructional handouts provided. Plan was discussed and patient/parent/guardian verbalized understanding.  If symptoms are not improving the patient should return to the Urgent Care for further evaluation. If symptoms are worsening or emergent present to Emergency Department for higher level of evaluation and care. Close follow up with PCP advised. Patient voiced understanding and agreeable. All questions answered. Patient appreciative.       Voncille Lo, MD

## 2013-01-17 ENCOUNTER — Encounter (INDEPENDENT_AMBULATORY_CARE_PROVIDER_SITE_OTHER): Payer: Self-pay | Admitting: Emergency Medical Services

## 2013-01-17 NOTE — Progress Notes (Unsigned)
Pt seen in urgent care yesterday. Attempted to call for follow up, no answer at this time. Left VM to call back with questions or concerns.   Maximiano Coss, RTR 01/17/2013, 8:26 AM

## 2013-02-24 ENCOUNTER — Encounter: Payer: 59 | Admitting: Geriatric Medicine

## 2013-02-27 ENCOUNTER — Encounter: Payer: Self-pay | Admitting: Geriatric Medicine

## 2013-02-27 ENCOUNTER — Ambulatory Visit: Payer: 59 | Admitting: Geriatric Medicine

## 2013-02-27 VITALS — BP 120/80 | HR 83 | Temp 98.6°F | Resp 14 | Ht 62.0 in | Wt 131.0 lb

## 2013-02-27 DIAGNOSIS — Z8619 Personal history of other infectious and parasitic diseases: Secondary | ICD-10-CM

## 2013-02-27 DIAGNOSIS — B009 Herpesviral infection, unspecified: Secondary | ICD-10-CM

## 2013-02-27 MED ORDER — ACYCLOVIR 200 MG CAPSULE
400.0000 mg | ORAL_CAPSULE | Freq: Two times a day (BID) | ORAL | Status: DC
Start: 2013-02-27 — End: 2013-02-27

## 2013-02-27 MED ORDER — ACYCLOVIR 400 MG TABLET
400.00 mg | ORAL_TABLET | Freq: Two times a day (BID) | ORAL | Status: DC
Start: 2013-02-27 — End: 2014-05-04

## 2013-02-27 NOTE — Progress Notes (Signed)
Subjective:     Patient ID:  Marie DoughtySheri Renee Gutierrez is an 48 y.o. female     Chief Complaint:    Chief Complaint   Patient presents with    Fever Blisters       HPI  she is here for med refill , she has hx of HSVtype 2.   she is here for med refill, she has hx of HSV tpe2. She takes Zovirax 400 mg BID as prophylaxy. She hadno episode of rash since last visit.   She denied exertional SOB/CP/dizziness    Past Medical History  Current Outpatient Prescriptions   Medication Sig    acyclovir (ZOVIRAX) 200 mg Oral Capsule Take 400 mg by mouth Twice daily      No Known Allergies  Past Medical History   Diagnosis Date    HSV-2 infection      Past Surgical History   Procedure Laterality Date    Hx no surgical procedures       Family History   Problem Relation Age of Onset    Hypertension Father      History     Social History    Marital Status: Divorced     Spouse Name: N/A     Number of Children: N/A    Years of Education: N/A     Occupational History          Social History Main Topics    Smoking status: Never Smoker     Smokeless tobacco: Never Used    Alcohol Use: No    Drug Use: Not on file    Sexual Activity: Not on file     Other Topics Concern    Not on file     Social History Narrative    No narrative on file         Review of Systems   Constitutional: Negative.    HENT: Negative.    Respiratory: Negative.    Cardiovascular: Negative.    Gastrointestinal: Negative.    Skin: Negative.        Objective:     BP 120/80   Pulse 83   Temp(Src) 37 C (98.6 F) (Tympanic)   Resp 14   Ht 1.575 m (5\' 2" )   Wt 59.421 kg (131 lb)   BMI 23.95 kg/m2   SpO2 98%   LMP 04/12/2012     Physical Exam   Constitutional: She appears well-developed and well-nourished.   HENT:   Head: Normocephalic and atraumatic.   Right Ear: External ear normal.   Left Ear: External ear normal.   Cardiovascular: Normal rate, regular rhythm and normal heart sounds.    Pulm:  Effort normal and breath sounds normal.    Lymphadenopathy:        Head  (right side): No submental, no submandibular, no tonsillar, no preauricular, no posterior auricular and no occipital adenopathy present.        Head (left side): No submental, no tonsillar, no preauricular, no posterior auricular and no occipital adenopathy present.     She has no cervical adenopathy.        Right: No supraclavicular adenopathy present.        Left: No supraclavicular adenopathy present.   Nursing note and vitals reviewed.          .    Assessment & Plan:       ICD-9-CM    1. Herpes simplex without mention of complication  No active infection at this time, stable,  she has been symptoms free for > one year 054.9 acyclovir (ZOVIRAX) 400 mg Oral Capsule BID   2. History of herpes genitalis V12.09    RTC in one year

## 2013-02-27 NOTE — Progress Notes (Signed)
Pt is here today for a follow up on her fever blisters. Theodoro KalataAmber Sueanne [place], [place]

## 2013-07-20 ENCOUNTER — Other Ambulatory Visit (HOSPITAL_BASED_OUTPATIENT_CLINIC_OR_DEPARTMENT_OTHER)
Admission: RE | Admit: 2013-07-20 | Discharge: 2013-07-20 | Disposition: A | Discharge status: Home / Self Care | Payer: 59 | Attending: Geriatric Medicine | Admitting: Geriatric Medicine

## 2013-07-20 ENCOUNTER — Ambulatory Visit: Payer: 59 | Admitting: Geriatric Medicine

## 2013-07-20 ENCOUNTER — Encounter: Payer: Self-pay | Admitting: Geriatric Medicine

## 2013-07-20 VITALS — BP 128/80 | HR 91 | Temp 98.4°F | Resp 12 | Ht 63.0 in | Wt 131.0 lb

## 2013-07-20 DIAGNOSIS — N3090 Cystitis, unspecified without hematuria: Secondary | ICD-10-CM

## 2013-07-20 MED ORDER — SULFAMETHOXAZOLE 800 MG-TRIMETHOPRIM 160 MG TABLET
1.00 | ORAL_TABLET | Freq: Two times a day (BID) | ORAL | Status: DC
Start: 2013-07-20 — End: 2013-11-19

## 2013-07-20 NOTE — Patient Instructions (Signed)
Post-coital micturation strongly advised,always wipe back wards and keep vaginal area clean.  Drink plenty of fluids- especially cranberry juice.

## 2013-07-20 NOTE — Progress Notes (Signed)
Subjective:     Patient ID:  Marie DoughtySheri Renee Gutierrez is an 48 y.o. female     Chief Complaint:    Chief Complaint   Patient presents with    Urinary Tract Infection    Blood in urine       Patient is a 48 y.o. female presenting with urinary pain. The history is provided by the patient. No language interpreter was used.   Urinary Tract Infection   This is a new (Pt here for possible uti, pt c/o urine frequency and urgency with traces of blood in her urine as well as lower back pain since 07/17/13) problem. The problem occurs every urination. The problem has been gradually worsening. The pain is at a severity of 2/10. There has been no fever. She is sexually active. There is no history of pyelonephritis. Associated symptoms include frequency and hematuria. Pertinent negatives include no chills, discharge, flank pain, hesitancy, nausea, possible pregnancy, sweats, urgency or vomiting. She has tried increased fluids for the symptoms. The treatment provided no relief. There is no history of catheterization, kidney stones, recurrent UTIs (last UTI was 07/18/2012, TXed with Bactrim DS, w/o problem), a single kidney, urinary stasis or a urological procedure.       Past Medical History  Current Outpatient Prescriptions   Medication Sig    acyclovir (ZOVIRAX) 400 mg Oral Tablet Take 1 Tab (400 mg total) by mouth Twice daily          No Known Allergies  Past Medical History   Diagnosis Date    HSV-2 infection      Past Surgical History   Procedure Laterality Date    Hx no surgical procedures       Family History   Problem Relation Age of Onset    Hypertension Father      History     Social History    Marital Status: Divorced     Spouse Name: N/A     Number of Children: N/A    Years of Education: N/A     Occupational History          Social History Main Topics    Smoking status: Never Smoker     Smokeless tobacco: Never Used    Alcohol Use: No    Drug Use: Not on file    Sexual Activity: Not on file     Other Topics  Concern    Not on file     Social History Narrative         Review of Systems   Constitutional: Negative.  Negative for chills.   HENT: Negative.    Respiratory: Negative.    Cardiovascular: Negative.    Gastrointestinal: Negative.  Negative for nausea and vomiting.   Genitourinary: Positive for dysuria, frequency and hematuria. Negative for hesitancy, urgency, flank pain, decreased urine volume, vaginal bleeding, vaginal discharge, difficulty urinating, vaginal pain and dyspareunia.   Musculoskeletal: Positive for back pain.       Objective:     BP 128/80    Pulse 91    Temp(Src) 36.9 C (98.4 F) (Tympanic)    Resp 12    Ht 1.6 m (5\' 3" )    Wt 59.421 kg (131 lb)    BMI 23.21 kg/m2      SpO2 98%    LMP 04/12/2012        Physical Exam   Constitutional: She is oriented to person, place, and time. She appears well-developed and well-nourished.   HENT:  Head: Normocephalic and atraumatic.   Right Ear: External ear normal.   Left Ear: External ear normal.   Eyes: No scleral icterus.   Pulmonary/Chest: Effort normal.   Abdominal: Soft. Bowel sounds are normal. She exhibits no distension. There is no hepatosplenomegaly. There is tenderness in the suprapubic area. There is no rigidity, no rebound, no guarding and no CVA tenderness.   Musculoskeletal: She exhibits no edema.   Neurological: She is alert and oriented to person, place, and time.   Nursing note and vitals reviewed.          Ortho Exam    Assessment & Plan:       ICD-9-CM    1. Cystitis 595.9 URINE CULTURE - [facility] ONLY     POCT URINE DIPSTICK: 3+ blood and 2+ LE    Bactrim DS BID #14    Post-coital micturation strongly advised,always wipe back wards and keep vaginal area clean.  Drink plenty of fluids- especially cranberry juice.

## 2013-07-20 NOTE — Progress Notes (Signed)
Pt here for possible uti, pt c/o urine frequency and urgency with traces of blood in her urine as well as lower back pain since 07/17/13. Addison NaegeliHillary Griffin, [place]

## 2013-07-22 LAB — URINE CULTURE: URINE CULTURE: <10000

## 2013-11-03 ENCOUNTER — Other Ambulatory Visit: Payer: Self-pay

## 2013-11-19 ENCOUNTER — Ambulatory Visit: Payer: 59 | Admitting: Geriatric Medicine

## 2013-11-19 ENCOUNTER — Encounter: Payer: Self-pay | Admitting: Geriatric Medicine

## 2013-11-19 DIAGNOSIS — R0600 Dyspnea, unspecified: Secondary | ICD-10-CM

## 2013-11-19 DIAGNOSIS — R5383 Other fatigue: Secondary | ICD-10-CM

## 2013-11-19 DIAGNOSIS — R42 Dizziness and giddiness: Secondary | ICD-10-CM

## 2013-11-19 DIAGNOSIS — K30 Functional dyspepsia: Secondary | ICD-10-CM

## 2013-11-19 MED ORDER — OMEPRAZOLE 40 MG CAPSULE,DELAYED RELEASE
40.0000 mg | DELAYED_RELEASE_CAPSULE | Freq: Every day | ORAL | Status: DC
Start: 2013-11-19 — End: 2014-05-04

## 2013-11-19 NOTE — Progress Notes (Signed)
Pt here for indigestion for 2 weeks coupled with chest discomfort and lightheadedness for 1 week; she felt like she was "going down" but she did not pass out. Marie NaegeliHillary Griffin, [place]

## 2013-11-19 NOTE — Progress Notes (Addendum)
Subjective:     Patient ID:  Marie DoughtySheri Renee Gutierrez is an 48 y.o. female     Chief Complaint:    Chief Complaint   Patient presents with    GI Problem    Lightheaded       The history is provided by the patient. No language interpreter was used.   Patient is a 48 year old female that presents to the office for complaints of indigestion for 2 weeks and lightheadedness for 1 week. She states her indigestion tends to bother her more in the evening and occasionally presents with dyspnea. She denies indigestion in the middle of the night or dyspnea that awakens her at night. She states her indigestion is always there and symptoms improve with the use of Seizer water. She states she has started to take OTC Prilosec, which she states she has not seen a change in symptoms. She states she has completely eliminated red meat from her diet. She states her lightheadedness is intermittent and comes and goes. She states her last episode (last night) was her worse episode, which warranted her to follow up with Dr. Alita ChyleKhajavi. She denies room spending, but states she feels like "blackness" is surrounding and closing in. She states she was trying to check out at the grocery store with her last occurrence and ended up "spacing" out, where the clerk had asked her the same question several times. She stated she had a headache during the occurrence. She has not done any treatments for her lightheadedness and states it resolves on its own. She states she is occasional dyspneic with exertion and has trouble climbing steps. She denies fevers or chills. All review of systems are negative.       Past Medical History  Current Outpatient Prescriptions   Medication Sig    acyclovir (ZOVIRAX) 400 mg Oral Tablet Take 1 Tab (400 mg total) by mouth Twice daily    omeprazole (PRILOSEC) 20 mg Oral Capsule, Delayed Release(E.C.) Take 20 mg by mouth Once a day     No Known Allergies  Past Medical History   Diagnosis Date    HSV-2 infection      .    Past  Surgical History   Procedure Laterality Date    Hx no surgical procedures           Family History   Problem Relation Age of Onset    Hypertension Father          History     Social History    Marital Status: Divorced     Spouse Name: N/A     Number of Children: N/A    Years of Education: N/A     Occupational History          Social History Main Topics    Smoking status: Never Smoker     Smokeless tobacco: Never Used    Alcohol Use: No    Drug Use: Not on file    Sexual Activity: Not on file     Other Topics Concern    Not on file     Social History Narrative         Review of Systems   Constitutional: Positive for fatigue. Negative for fever, chills and diaphoresis.   HENT: Negative for sinus pressure.    Eyes: Negative.    Respiratory: Positive for chest tightness and shortness of breath. Negative for apnea, cough, choking and wheezing.         Reports dyspnea with  exertion   Cardiovascular: Negative.  Negative for chest pain, palpitations and leg swelling.   Gastrointestinal: Negative.  Negative for blood in stool and anal bleeding.   Endocrine: Negative.    Genitourinary: Negative.  Negative for hematuria.   Musculoskeletal: Negative.    Skin: Negative.    Allergic/Immunologic: Negative.    Neurological: Positive for light-headedness and headaches. Negative for dizziness and syncope.   Hematological: Negative.    Psychiatric/Behavioral: Negative.    All other systems reviewed and are negative.      Objective:     BP 142/90 mmHg   Pulse 93   Temp(Src) 36.9 C (98.4 F) (Tympanic)   Resp 12   Ht 1.575 m (5\' 2" )   Wt 60.056 kg (132 lb 6.4 oz)   BMI 24.21 kg/m2   SpO2 99%   LMP 04/12/2012     Physical Exam   Constitutional: She is oriented to person, place, and time. She appears well-developed and well-nourished. No distress.   HENT:   Head: Normocephalic and atraumatic.   Right Ear: Tympanic membrane, external ear and ear canal normal.   Left Ear: Tympanic membrane, external ear and ear canal normal.      Nose: Nose normal.   Mouth/Throat: Oropharynx is clear and moist and mucous membranes are normal. No oropharyngeal exudate.   Eyes: Conjunctivae, EOM and lids are normal. Pupils are equal, round, and reactive to light.   Neck: Normal range of motion and full passive range of motion without pain. Neck supple. Carotid bruit is not present. No thyroid mass and no thyromegaly present.   No carotid bruit present.     Cardiovascular: Normal rate, regular rhythm and intact distal pulses.    Murmur (late systolic) heard.   Systolic murmur is present with a grade of 2/6   Late Systolic Murmur   Pulmonary/Chest: Effort normal and breath sounds normal. No respiratory distress. She has no wheezes. She has no rales. She exhibits no tenderness.   Abdominal: Soft. Bowel sounds are normal. She exhibits no distension, no abdominal bruit, no pulsatile midline mass and no mass. There is no hepatosplenomegaly. There is no tenderness. There is no rigidity, no rebound, no guarding and no CVA tenderness.   No epigastric tenderness. No renal or abdominal bruit present.      Musculoskeletal: Normal range of motion. She exhibits no edema or tenderness.   Lymphadenopathy:     She has no cervical adenopathy.        Right: No supraclavicular adenopathy present.        Left: No supraclavicular adenopathy present.   Neurological: She is alert and oriented to person, place, and time. She has normal reflexes. She displays tremor. She displays no atrophy. No cranial nerve deficit or sensory deficit. She exhibits normal muscle tone. She displays a negative Romberg sign. Coordination and gait normal. GCS eye subscore is 4. GCS verbal subscore is 5. GCS motor subscore is 6.   Skin: Skin is warm and dry. No rash noted. She is not diaphoretic. No cyanosis. Nails show no clubbing.   Nursing note and vitals reviewed.    Ortho Exam    Assessment & Plan:       ICD-10-CM    1. Fatigue R53.83 CBC W/ AUTOMATED DIFF - [facility] ONLY     TSH CASCADE,3RD GEN WITH  REFLEX TO FT4 - [facility] ONLY     EAST ECG ([facility] ONLY)     MAGNESIUM     COMPREHENSIVE METABOLIC PROFILE - [facility]  ONLY    R/O Thyroid disease, anemia, infection, cardiac disease, and magnesium deficiencies.      2. Dyspnea R06.00 CBC W/ AUTOMATED DIFF - [facility] ONLY     TSH CASCADE,3RD GEN WITH REFLEX TO FT4 - [facility] ONLY     XR CHEST PA & LATERAL     EAST ECG ([facility]B ONLY): Within normal limits, normal axis, heart rate 80, QTc interval 403     MAGNESIUM     COMPREHENSIVE METABOLIC PROFILE - [facility] ONLY     STRESS ECHO - ADULT (PRE&POST STRESS)    R/O Thyroid disease, anemia, infection, cardiac disease, and magnesium deficiencies.      3. Lightheadedness R42 CBC W/ AUTOMATED DIFF - [facility] ONLY     TSH CASCADE,3RD GEN WITH REFLEX TO FT4 - [facility] ONLY     EAST ECG ([facility]B ONLY)     MAGNESIUM     COMPREHENSIVE METABOLIC PROFILE - [facility] ONLY     STRESS ECHO - ADULT (PRE&POST STRESS)    R/O Thyroid disease, anemia, infection, cardiac disease, and magnesium deficiencies.      4. Indigestion K30 H. PYLORI,IGG AB SCREEN - [facility] ONLY  Prilosec 40 mg PO before evening meal and instructed on how to take medication and side effects of medication. Will assess for H. Pylori infection.     F/U 4 weeks or PRN    Rayford HalstedMary Beth Stonebraker, [place]NP-STUDENT        I saw and examined the patient on November 19, 2013 and discussed management with the student. I reviewed the student's note and agree with the documented findings and plan of care except as noted below.  Dennie BibleMehran Persais Ethridge, MD

## 2013-11-20 LAB — COMPREHENSIVE METABOLIC PROFILE - BMC/JMC ONLY
ALBUMIN: 4.4 (ref 4.0–5.0)
ALKALINE PHOSPHATASE: 96 (ref 35–133)
ALT (SGPT): 58 — AB (ref 4–45)
ANION GAP: 15 (ref 3.0–18.0)
AST (SGOT): 45 — AB (ref 6–39)
BILIRUBIN, TOTAL: 0.8 (ref 0.2–1.5)
BUN: 10 (ref 6–28)
CALCIUM: 9.7 (ref 8.5–10.5)
CARBON DIOXIDE: 25 (ref 22–32)
CHLORIDE: 103 (ref 98–112)
CREATININE: 0.66 (ref 0.54–1.24)
ESTIMATED GLOMERULAR FILTRATION RATE: 121
GLUCOSE: 103 — AB (ref 70–100)
OTHER: 3.1 (ref 2.0–3.6)
POTASSIUM: 5 (ref 3.6–5.4)
SODIUM: 143 (ref 134–149)
TOTAL PROTEIN: 7.5 (ref 6.4–8.5)

## 2013-11-20 LAB — MAGNESIUM: MAGNESIUM: 1.9 (ref 1.6–2.6)

## 2013-11-20 LAB — CBC W/ AUTOMATED DIFF - BMC ONLY
BASOPHIL #: 0.07 (ref 0.00–0.33)
BASOPHIL %: 1.2
EOSINOPHIL #: 0.08 (ref 0.00–0.77)
EOSINOPHIL %: 1.4
HEMATOCRIT: 39.5 (ref 36.0–48.0)
HEMOGLOBIN: 13.4 (ref 12.0–16.0)
HEMOGLOBIN: 13.4 (ref 12.0–16.0)
LYMPHOCYTE #: 1.74 (ref 1.60–5.06)
LYMPHOCYTE #: 1.74 (ref 1.60–5.06)
LYMPHOCYTES %: 30.5
MCH: 33.3 (ref 27.0–34.5)
MCHC: 33.9 (ref 32.0–36.5)
MCV: 98.3 (ref 80.0–100.0)
MCV: 98.3 (ref 80.0–100.0)
MONOCYTE #: 0.47 (ref 0.12–1.65)
MONOCYTE %: 8.3
NEUTROPHIL #: 3.33 (ref 1.68–8.58)
NEUTROPHIL %: 58.4
PLATELET COUNT: 296 (ref 130–440)
RBC: 4.02 (ref 3.8–5.00)
RDW: 12 (ref 12.0–15.0)
WBC: 5.7 (ref 4.0–11.0)

## 2013-11-20 LAB — TSH CASCADE,3RD GEN WITH REFLEX TO FT4: TSH: 1.616 (ref 0.350–5.500)

## 2013-11-20 LAB — H. PYLORI,IGG AB SCREEN: H. PYLORI IgG: NEGATIVE

## 2013-11-23 ENCOUNTER — Other Ambulatory Visit: Payer: Self-pay | Admitting: Geriatric Medicine

## 2013-11-23 ENCOUNTER — Ambulatory Visit
Admission: RE | Admit: 2013-11-23 | Discharge: 2013-11-23 | Disposition: A | Discharge status: Home / Self Care | Payer: 59 | Source: Ambulatory Visit | Attending: Geriatric Medicine | Admitting: Geriatric Medicine

## 2013-11-23 DIAGNOSIS — R0600 Dyspnea, unspecified: Secondary | ICD-10-CM

## 2013-11-23 DIAGNOSIS — R42 Dizziness and giddiness: Secondary | ICD-10-CM

## 2013-11-25 ENCOUNTER — Ambulatory Visit: Payer: Self-pay

## 2013-11-29 DIAGNOSIS — K30 Functional dyspepsia: Secondary | ICD-10-CM | POA: Insufficient documentation

## 2013-11-29 DIAGNOSIS — R42 Dizziness and giddiness: Secondary | ICD-10-CM | POA: Insufficient documentation

## 2013-12-03 ENCOUNTER — Other Ambulatory Visit: Payer: Self-pay | Admitting: Geriatric Medicine

## 2013-12-04 ENCOUNTER — Other Ambulatory Visit: Payer: Self-pay

## 2013-12-04 ENCOUNTER — Other Ambulatory Visit: Payer: Self-pay | Admitting: Geriatric Medicine

## 2013-12-04 DIAGNOSIS — K30 Functional dyspepsia: Secondary | ICD-10-CM

## 2013-12-04 DIAGNOSIS — R42 Dizziness and giddiness: Secondary | ICD-10-CM

## 2013-12-04 DIAGNOSIS — R7989 Other specified abnormal findings of blood chemistry: Secondary | ICD-10-CM

## 2013-12-04 DIAGNOSIS — R5383 Other fatigue: Secondary | ICD-10-CM

## 2013-12-04 DIAGNOSIS — R945 Abnormal results of liver function studies: Principal | ICD-10-CM

## 2013-12-04 DIAGNOSIS — R0600 Dyspnea, unspecified: Secondary | ICD-10-CM

## 2013-12-21 ENCOUNTER — Ambulatory Visit: Payer: 59 | Admitting: Geriatric Medicine

## 2013-12-21 ENCOUNTER — Encounter: Payer: Self-pay | Admitting: Geriatric Medicine

## 2013-12-21 VITALS — BP 118/78 | HR 71 | Temp 98.4°F | Resp 12 | Ht 62.0 in | Wt 135.4 lb

## 2013-12-21 DIAGNOSIS — K30 Functional dyspepsia: Secondary | ICD-10-CM

## 2013-12-21 DIAGNOSIS — R7989 Other specified abnormal findings of blood chemistry: Secondary | ICD-10-CM

## 2013-12-21 DIAGNOSIS — R945 Abnormal results of liver function studies: Secondary | ICD-10-CM

## 2013-12-21 DIAGNOSIS — R42 Dizziness and giddiness: Secondary | ICD-10-CM

## 2013-12-21 NOTE — Progress Notes (Signed)
Pt here for a 4 week follow from her previous appointment for chest pain SOB and dizziness. She states that her chest pain and SOB have gone but slight dizziness remains. She describes it as lasting only 2-3 minutes after standing up, she feels like the everything around her is moving but not her and sometimes she must lean up against the wall to stop the feeling of falling. She denies falling or loss of consciousness. Addison NaegeliHillary Griffin, [place]

## 2013-12-21 NOTE — Progress Notes (Signed)
Subjective:     Patient ID:  Marie DoughtySheri Renee Gutierrez is an 48 y.o. female     Chief Complaint:    Chief Complaint   Patient presents with    Dizziness       HPI     Patient here for a 4 week follow from her previous appointment on 11/19/13 for chest pain SOB and dizziness. She states that her chest pain and SOB have gone but slight dizziness remains. She describes it as lasting only 2-3 minutes after standing up, she feels like the everything around her is moving but not her and sometimes she must lean up against the wall to stop the feeling of falling. She denies falling or loss of consciousness.she denied CP/SOB/URI symptoms, GI symptoms.   She also had hx of indigestion, H pylori was negative, prilosec was given has improved sig.   CBC  Diff   Lab Results   Component Value Date/Time    WBC 5.7 11/19/2013    PLTCNT 296 11/19/2013    RBC 4.02 11/19/2013    MCV 98.3 11/19/2013    MCHC 33.9 11/19/2013    MCH 33.3 11/19/2013    RDW 12.0 11/19/2013    No results found for: PMNS, LYMPHOCYTES, EOSINOPHIL, MONOCYTES, BASOPHILS, PMNABS, LYMPHSABS, EOSABS, MONOSABS, BASOSABS     Comprehensive Metabolic Profile    Lab Results   Component Value Date/Time    SODIUM 143 11/19/2013    POTASSIUM 5.0 11/19/2013    CHLORIDE 103 11/19/2013    CO2 25 11/19/2013    ANIONGAP 15.0 11/19/2013    BUN 10 11/19/2013    CREATININE 0.66 11/19/2013    GLUCOSE NEG 07/18/2012 09:44 AM    Lab Results   Component Value Date/Time    CALCIUM 9.7 11/19/2013    TOTALPROTEIN 7.5 11/19/2013    AST 45* 11/19/2013    ALT 58* 11/19/2013        TSH normal,   She cancelled her stress echo due to high deductible, CXR normal    Past Medical History  Current Outpatient Prescriptions   Medication Sig    acyclovir (ZOVIRAX) 400 mg Oral Tablet Take 1 Tab (400 mg total) by mouth Twice daily    Omeprazole (PRILOSEC) 40 mg Oral Capsule, Delayed Release(E.C.) Take 1 Cap (40 mg total) by mouth Once a day     No Known Allergies  Past Medical History   Diagnosis Date     HSV-2 infection          Past Surgical History   Procedure Laterality Date    Hx no surgical procedures           Family History   Problem Relation Age of Onset    Hypertension Father          History     Social History    Marital Status: Divorced     Spouse Name: N/A     Number of Children: N/A    Years of Education: N/A     Occupational History          Social History Main Topics    Smoking status: Never Smoker     Smokeless tobacco: Never Used    Alcohol Use: No    Drug Use: Not on file    Sexual Activity: Not on file     Other Topics Concern    Not on file     Social History Narrative         Review of Systems  Constitutional: Negative.    HENT: Negative.    Respiratory: Negative.    Cardiovascular: Negative.    Gastrointestinal: Negative.    Genitourinary: Negative.    Musculoskeletal: Negative.    Neurological: Positive for light-headedness (occ upon standing).   Hematological: Negative.    Psychiatric/Behavioral: Negative.        Objective:     BP 118/78 mmHg   Pulse 71   Temp(Src) 36.9 C (98.4 F) (Tympanic)   Resp 12   Ht 1.575 m (5\' 2" )   Wt 61.417 kg (135 lb 6.4 oz)   BMI 24.76 kg/m2   SpO2 99%   LMP 04/12/2012     Physical Exam   Constitutional: She is oriented to person, place, and time. She appears well-developed and well-nourished.   HENT:   Head: Normocephalic and atraumatic.   Right Ear: External ear normal.   Left Ear: External ear normal.   Nose: Nose normal.   Mouth/Throat: Oropharynx is clear and moist. No oropharyngeal exudate.   Eyes: Conjunctivae and EOM are normal. Pupils are equal, round, and reactive to light. No scleral icterus.   Neck: Normal range of motion. Neck supple. No thyromegaly present.   Cardiovascular: Normal rate, regular rhythm, normal heart sounds and intact distal pulses.    Pulmonary/Chest: Effort normal and breath sounds normal.   Abdominal: Soft. Bowel sounds are normal. She exhibits no distension. There is no tenderness. There is no rebound and no  guarding.   Musculoskeletal: She exhibits no edema.   Neurological: She is alert and oriented to person, place, and time. She displays no atrophy, no tremor and normal reflexes. No cranial nerve deficit or sensory deficit. She exhibits normal muscle tone. She displays a negative Romberg sign. Coordination normal. GCS eye subscore is 4. GCS verbal subscore is 5. GCS motor subscore is 6.   Nursing note and vitals reviewed.          Ortho Exam    Assessment & Plan:       ICD-10-CM    1. Lightheadedness  Most likely 2nd to orthostatsis R42 Continue current plan  Push fluid  Add salt to her food  May use compression stocking.   2. Indigestion K30 Make prilosec PRN   3. Elevated LFTs R79.89 HEPATIC FUNCTION PANEL

## 2014-01-01 ENCOUNTER — Other Ambulatory Visit: Payer: Self-pay | Admitting: Geriatric Medicine

## 2014-01-01 DIAGNOSIS — R7989 Other specified abnormal findings of blood chemistry: Secondary | ICD-10-CM

## 2014-01-01 DIAGNOSIS — R945 Abnormal results of liver function studies: Principal | ICD-10-CM

## 2014-05-04 ENCOUNTER — Encounter: Payer: Self-pay | Admitting: Geriatric Medicine

## 2014-05-04 ENCOUNTER — Ambulatory Visit: Payer: 59 | Admitting: Geriatric Medicine

## 2014-05-04 VITALS — BP 126/70 | HR 84 | Temp 99.1°F | Resp 12 | Ht 63.0 in | Wt 134.6 lb

## 2014-05-04 DIAGNOSIS — B009 Herpesviral infection, unspecified: Secondary | ICD-10-CM

## 2014-05-04 DIAGNOSIS — Z8619 Personal history of other infectious and parasitic diseases: Secondary | ICD-10-CM

## 2014-05-04 MED ORDER — ACYCLOVIR 400 MG TABLET
400.0000 mg | ORAL_TABLET | Freq: Two times a day (BID) | ORAL | Status: DC
Start: 2014-05-04 — End: 2015-06-08

## 2014-05-04 NOTE — Progress Notes (Signed)
Pt here for a refill on her Acyclovir.  Addison NaegeliHillary Griffin, [place]

## 2014-05-04 NOTE — Progress Notes (Signed)
Subjective:     Patient ID:  Marie DoughtySheri Renee Gutierrez is an 49 y.o. female     Chief Complaint:    Chief Complaint   Patient presents with    Medication Refill    Results       The history is provided by the patient and medical records. No language interpreter was used.   she is here for med refill , she has hx of HSVtype 2.   she is here for med refill, she has hx of HSV tpe2. She takes Zovirax 400 mg BID as prophylaxy. She hadno episode of rash since last visit.   She denied exertional SOB/CP/dizziness  Few months ago she had lightheadedness, she feels better. She was found to have elevated LFTs, she has not gone for blood test yet.  Past Medical History  Current Outpatient Prescriptions   Medication Sig    acyclovir (ZOVIRAX) 400 mg Oral Tablet Take 1 Tab (400 mg total) by mouth Twice daily     No Known Allergies  Past Medical History   Diagnosis Date    HSV-2 infection          Past Surgical History   Procedure Laterality Date    Hx no surgical procedures           Family History   Problem Relation Age of Onset    Hypertension Father          History     Social History    Marital Status: Divorced     Spouse Name: N/A    Number of Children: N/A    Years of Education: N/A     Occupational History          Social History Main Topics    Smoking status: Never Smoker     Smokeless tobacco: Never Used    Alcohol Use: No    Drug Use: Not on file    Sexual Activity: Not on file     Other Topics Concern    Not on file     Social History Narrative         Review of Systems   Constitutional: Negative.    HENT: Negative.    Respiratory: Negative.    Cardiovascular: Negative.    Skin: Negative.    Neurological: Negative.    Hematological: Negative.        Objective:     BP 126/70 mmHg   Pulse 84   Temp(Src) 37.3 C (99.1 F) (Tympanic)   Resp 12   Ht 1.6 m (5\' 3" )   Wt 61.054 kg (134 lb 9.6 oz)   BMI 23.85 kg/m2   SpO2 98%   LMP 04/12/2012     Physical Exam   Constitutional: She is oriented to person, place, and  time. She appears well-developed and well-nourished.   HENT:   Head: Normocephalic and atraumatic.   Right Ear: External ear normal.   Cardiovascular: Normal rate, regular rhythm and normal heart sounds.    Pulmonary/Chest: Effort normal and breath sounds normal.   Neurological: She is alert and oriented to person, place, and time. She displays a negative Romberg sign. Coordination and gait normal.   Skin: No rash noted. No cyanosis. Nails show no clubbing.   Nursing note and vitals reviewed.          Ortho Exam    Assessment & Plan:       ICD-10-CM    1. Herpes simplex virus (HSV) infection B00.9 acyclovir (ZOVIRAX) 400  mg Oral Tablet   2. History of herpes genitalis Z86.19 acyclovir (ZOVIRAX) 400 mg Oral Tablet   RTC in one year

## 2014-05-05 ENCOUNTER — Encounter: Payer: Self-pay | Admitting: Geriatric Medicine

## 2014-12-19 ENCOUNTER — Other Ambulatory Visit: Payer: Self-pay

## 2015-06-08 ENCOUNTER — Encounter (INDEPENDENT_AMBULATORY_CARE_PROVIDER_SITE_OTHER): Payer: Self-pay | Admitting: Geriatric Medicine

## 2015-06-08 ENCOUNTER — Ambulatory Visit (INDEPENDENT_AMBULATORY_CARE_PROVIDER_SITE_OTHER): Payer: 59 | Admitting: Geriatric Medicine

## 2015-06-08 VITALS — BP 110/72 | HR 86 | Temp 99.2°F | Resp 12 | Ht 62.0 in | Wt 128.8 lb

## 2015-06-08 DIAGNOSIS — B009 Herpesviral infection, unspecified: Secondary | ICD-10-CM | POA: Insufficient documentation

## 2015-06-08 DIAGNOSIS — Z6823 Body mass index (BMI) 23.0-23.9, adult: Secondary | ICD-10-CM

## 2015-06-08 DIAGNOSIS — Z8619 Personal history of other infectious and parasitic diseases: Secondary | ICD-10-CM

## 2015-06-08 MED ORDER — ACYCLOVIR 400 MG TABLET
400.0000 mg | ORAL_TABLET | Freq: Two times a day (BID) | ORAL | 12 refills | Status: DC
Start: 2015-06-08 — End: 2016-07-31

## 2015-06-08 NOTE — Progress Notes (Signed)
Patient is a 50 year old female who presents today for medication refills.  Pt is requesting refill for her Zovirax.  Pt states she takes medication as prescribed and has no side effects.  Pt has no further concerns at this time. Sheliah HatchShayna Gratton, MA

## 2015-06-08 NOTE — Progress Notes (Signed)
Subjective:     Patient ID:  Marie Gutierrez is an 50 y.o. female     Chief Complaint:    Chief Complaint   Patient presents with    FOLLOW UP MED CHECK     zovirax       HPI     Marie Gutierrez is a 50 y.o female who presents for a follow up on HSV type 2. Patient currently takes Zovirax 400 mg BID, but states she takes it sometimes once a day. Patient states that she is doing well on the medication with no side effects or concerns, no flare up since being on Zovitax  Patient states that she used to see Dr Sande Brothers in [place], [place], but she has not seen him in a while. Pt states that she does not do Mammograms but will have done after her visit with GYN.    Past Medical History  Current Outpatient Prescriptions   Medication Sig    acyclovir (ZOVIRAX) 400 mg Oral Tablet Take 1 Tab (400 mg total) by mouth Twice daily     No Known Allergies  Past Medical History:   Diagnosis Date    HSV-2 infection      Past Surgical History:   Procedure Laterality Date    HX NO SURGICAL PROCEDURES       Family History   Problem Relation Age of Onset    Hypertension Father      Social History     Social History    Marital status: Divorced     Spouse name: N/A    Number of children: N/A    Years of education: N/A     Occupational History     [employer]     Social History Main Topics    Smoking status: Never Smoker    Smokeless tobacco: Never Used    Alcohol use No    Drug use: Not on file    Sexual activity: Not on file     Other Topics Concern    Not on file     Social History Narrative     Review of Systems  Constitutional: Negative.   HENT: Negative.   Respiratory: Negative.   Cardiovascular: Negative.   Skin: Negative.   Neurological: Negative.   Hematological: Negative.     Objective:     BP 110/72   Pulse 86   Temp 37.3 C (99.2 F) (Tympanic)    Resp 12   Ht 1.575 m ( )   Wt 58.4 kg (128 lb 12.8 oz)   LMP 04/12/2012   SpO2 99%   Breastfeeding? No   BMI 23.56 kg/m2     Physical Exam      Constitutional: She is oriented to person, place, and time. She appears well-developed and well-nourished.   HENT:   Head: Normocephalic and atraumatic.   Right Ear: External ear normal.   Left Ear: External ear normal.   Cardiovascular: Normal rate, regular rhythm and normal heart sounds.    Pulmonary/Chest: Effort normal and breath sounds normal.   Musculoskeletal: She exhibits no edema.   Lymphadenopathy:        Head (right side): No submental, no submandibular, no tonsillar and no preauricular adenopathy present.        Head (left side): No submental, no submandibular, no tonsillar, no preauricular and no posterior auricular adenopathy present.     She has no cervical adenopathy.        Right: No supraclavicular adenopathy present.  Left: No supraclavicular adenopathy present.   Neurological: She is alert and oriented to person, place, and time. Coordination normal.   Nursing note and vitals reviewed.        Ortho Exam    Assessment & Plan:       ICD-10-CM    1. Herpes simplex virus (HSV) infection B00.9 acyclovir (ZOVIRAX) 400 mg Oral Tablet   2. History of herpes genitalis Z86.19 acyclovir (ZOVIRAX) 400 mg Oral Tablet   3. Body mass index (BMI) 23.0-23.9, adult Z68.23 Healthy diet   RTC PRN  Or in one year

## 2015-12-24 ENCOUNTER — Other Ambulatory Visit: Payer: Self-pay

## 2016-07-31 ENCOUNTER — Other Ambulatory Visit: Payer: Self-pay | Admitting: Geriatric Medicine

## 2016-07-31 DIAGNOSIS — Z8619 Personal history of other infectious and parasitic diseases: Secondary | ICD-10-CM

## 2016-07-31 DIAGNOSIS — B009 Herpesviral infection, unspecified: Secondary | ICD-10-CM

## 2016-07-31 MED ORDER — ACYCLOVIR 400 MG TABLET
400.0000 mg | ORAL_TABLET | Freq: Two times a day (BID) | ORAL | 0 refills | Status: DC
Start: 2016-07-31 — End: 2016-08-13

## 2016-07-31 NOTE — Telephone Encounter (Signed)
Please review and sign, thank you. Her next appointment is 08/13/16, but she will run out by that time. Addison NaegeliHillary Griffin, [place]

## 2016-08-13 ENCOUNTER — Encounter (INDEPENDENT_AMBULATORY_CARE_PROVIDER_SITE_OTHER): Payer: Self-pay | Admitting: Geriatric Medicine

## 2016-08-13 ENCOUNTER — Ambulatory Visit (INDEPENDENT_AMBULATORY_CARE_PROVIDER_SITE_OTHER): Payer: 59 | Admitting: Geriatric Medicine

## 2016-08-13 VITALS — BP 120/82 | HR 77 | Temp 98.4°F | Resp 12 | Ht 63.0 in | Wt 123.6 lb

## 2016-08-13 DIAGNOSIS — Z131 Encounter for screening for diabetes mellitus: Secondary | ICD-10-CM

## 2016-08-13 DIAGNOSIS — Z1211 Encounter for screening for malignant neoplasm of colon: Secondary | ICD-10-CM

## 2016-08-13 DIAGNOSIS — R7989 Other specified abnormal findings of blood chemistry: Secondary | ICD-10-CM

## 2016-08-13 DIAGNOSIS — Z0000 Encounter for general adult medical examination without abnormal findings: Secondary | ICD-10-CM

## 2016-08-13 DIAGNOSIS — B009 Herpesviral infection, unspecified: Secondary | ICD-10-CM

## 2016-08-13 DIAGNOSIS — Z79899 Other long term (current) drug therapy: Secondary | ICD-10-CM

## 2016-08-13 DIAGNOSIS — Z1212 Encounter for screening for malignant neoplasm of rectum: Secondary | ICD-10-CM

## 2016-08-13 DIAGNOSIS — R945 Abnormal results of liver function studies: Secondary | ICD-10-CM

## 2016-08-13 DIAGNOSIS — Z13220 Encounter for screening for lipoid disorders: Secondary | ICD-10-CM

## 2016-08-13 DIAGNOSIS — Z6821 Body mass index (BMI) 21.0-21.9, adult: Secondary | ICD-10-CM

## 2016-08-13 DIAGNOSIS — Z7189 Other specified counseling: Secondary | ICD-10-CM

## 2016-08-13 MED ORDER — ACYCLOVIR 400 MG TABLET
400.0000 mg | ORAL_TABLET | Freq: Two times a day (BID) | ORAL | 1 refills | Status: DC
Start: 2016-08-13 — End: 2016-12-24

## 2016-08-13 NOTE — Progress Notes (Signed)
Subjective:     Patient ID:  Marie DoughtySheri Renee Gutierrez is an 51 y.o. female     Chief Complaint:    Chief Complaint   Patient presents with   . Medication Refill       The history is provided by the patient and medical records. No language interpreter was used.      WELL WOMAN CPE HPI  Marie DoughtySheri Renee Ludvigsen is a 51 y.o. female here for a comprehensive physical exam. The patient has history of  HSV type 2.  She currently takes Zovirax 400 mg BID.  She reports no recent outbreaks.  She does reports mild sore in her nares which is intermittent lasting only 2 days at a time.    Depression Screen:  1. During the past month, have you often been bothered by feeling down, depressed, or hopeless? no  2. During the past month, have you often been bothered by little interest or pleasure in doing things? no    OB/Gyn History:  Patient receives gynecological care outside our clinic: yes. [place]  Date of last pelvic exam: NUTD.  Discussed to schedule.  Denies hx of abnormal PAP or +HPV  Date of last mammogram: never performed.  Performs at home checks w/o concern.  Date of last DEXA scan: N/A    Do you take any herbs or supplements that were not prescribed by a doctor? no  Are you taking any calcium supplements? no  Are you taking aspirin daily?no  Are you interested in HIV screening? no  Have you had the "Gardasil" cervical cancer vaccination? not applicable    Last Tetanus vaccine: 08/2014  Last Colonoscopy: Never performed and declines today.  Discussed alternate FIT card.  Last Influenza Vaccine: NUTD  Last Pneumonia vaccine: NUTD  Last Shingle vaccine: N/A    Functional/Nutritional Assessment (see questionnaire completed by nursing and reviewed by provider:yes      Past Medical History  Current Outpatient Prescriptions   Medication Sig   . acyclovir (ZOVIRAX) 400 mg Oral Tablet Take 1 Tab (400 mg total) by mouth Twice daily     No Known Allergies  Past Medical History:   Diagnosis Date   . HSV-2 infection           Past Surgical History:   Procedure Laterality Date   . HX NO SURGICAL PROCEDURES           Family Medical History     Problem Relation (Age of Onset)    Hypertension Father            Social History     Social History   . Marital status: Divorced     Spouse name: N/A   . Number of children: N/A   . Years of education: N/A     Occupational History   .  [employer]     Social History Main Topics   . Smoking status: Never Smoker   . Smokeless tobacco: Never Used   . Alcohol use No   . Drug use: Not on file   . Sexual activity: Not on file     Other Topics Concern   . Not on file     Social History Narrative         Review of Systems     Constitutional: Negative.    HENT: +post nasal drip otherwise negative    Eyes: Negative.    Respiratory: Negative.    Cardiovascular: Negative.  Gastrointestinal: Negative.    Genitourinary: Negative.    Musculoskeletal: Negative.    Skin: Negative.    Neurological: Negative.    Endo/Heme/Allergies: Negative.    Psychiatric/Behavioral: Negative.    All other systems reviewed and are negative.    Objective:     BP 120/82  Pulse 77  Temp 36.9 C (98.4 F) (Tympanic)   Resp 12  Ht 1.6 m (5\' 3" )  Wt 56.1 kg (123 lb 9.6 oz)  LMP 04/18/2012  SpO2 99%  BMI 21.89 kg/m2     Physical Exam    Constitutional: SHe is oriented to person, place, and time. SHe appears well-developed and well-nourished.   HENT:   Head: Normocephalic and atraumatic.   Right Ear: Hearing, tympanic membrane, external ear and ear canal normal.   Left Ear: Hearing, tympanic membrane and external ear normal.   Nose: Nose normal.   Mouth/Throat: Uvula is midline, oropharynx is clear and moist and mucous membranes are normal. No oropharyngeal exudate.   Eyes: Conjunctivae, EOM and lids are normal. No scleral icterus.   Neck: Normal range of motion and full passive range of motion without pain. Neck supple. No JVD present. Carotid bruit is not present. No tracheal deviation present. No mass and no  thyromegaly present.   Cardiovascular: Normal rate, regular rhythm, normal heart sounds and intact distal pulses.    Pulm:  Effort normal and breath sounds normal.  Observations:  no respiratory distress and no stridor.  The chest exhibits no tenderness.    Abdomen:   Normal appearance and bowel sounds are normal. SHe exhibits no distension. Soft. There is no hepatosplenomegaly. No tenderness. SHe has no rigidity, no rebound and no guarding.   Waist Circumference: <40 inches  Ortho/Musculoskeletal:   Normal range of motion. SHe exhibits no edema and no tenderness.   Lymphadenopathy:        Head (right side): No submental, no submandibular, no tonsillar, no preauricular, no posterior auricular and no occipital adenopathy present.        Head (left side): No submental, no submandibular, no tonsillar, no preauricular, no posterior auricular and no occipital adenopathy present.     SHe has no cervical adenopathy.     SHe has no axillary adenopathy.        Right: No inguinal and no supraclavicular adenopathy present.        Left: No inguinal and no supraclavicular adenopathy present.   Neurological: SHe is alert and oriented to person, place, and time. SHe has normal strength and normal reflexes. SHe displays no atrophy, no tremor and normal reflexes. No cranial nerve deficit or sensory deficit. SHe exhibits normal muscle tone. SHe displays a negative Romberg sign. Coordination and gait normal. GCS eye subscore is 4. GCS verbal subscore is 5. GCS motor subscore is 6.   Skin: Skin is warm. No rash noted. No cyanosis or erythema. Nails show no clubbing.   Psychiatric: SHe has a normal mood and affect. Her speech is normal and behavior is normal. Judgment and thought content normal. Cognition and memory are normal.   Nursing note and vitals reviewed.    Ortho Exam    Assessment & Plan:       ICD-10-CM    1. PE (physical exam), annual Z00.00 LIPID PANEL     GLUCOSE - [facility] ONLY   2. Encounter for colorectal cancer  screening Z12.11 FECAL OCCULT BLOOD, COLORECTAL CANCER SCREEN, QUALITATIVE, IMMUNOCHEMICAL    Z12.12    3. Screening, lipid Z13.220 LIPID  PANEL   4. Encounter for screening examination for impaired glucose regulation and diabetes mellitus Z13.1 GLUCOSE - [facility] ONLY   5. BMI 21.0-21.9, adult Z68.21    6. Counseling regarding advanced directives Z71.89    7. Herpes simplex virus (HSV) infection B00.9 acyclovir (ZOVIRAX) 400 mg Oral Tablet   8. Elevated LFTs R79.89 HEPATIC FUNCTION PANEL   9. Medication management Z79.899          BMI addressed: Advised on diet, excercise and healthy lifestyle.   Discussed with patient importance of colon cancer screening, breast cancer screening, healthy diet and exercise, influnenza vaccination, immunization, safety at work/while driving and Living Will/Advance directive were discussed in details.Advance directive form was provided for patient. Denies colonoscopy at this time but willing to perform FIT stool card.  Will follow up with GYN for PAP/Mammogram.  Perform saline nasal rinse for post nasal drip/seasonal environmental allergies.  RTC in one year for annual physical/follow up.

## 2016-08-13 NOTE — Nursing Note (Signed)
Patient is here for a medication refill for the medication Zovirax. Patient states that she has no problems with the medication and that the medication is still effective.  Addison NaegeliHillary [place], [place]

## 2016-12-24 ENCOUNTER — Encounter (INDEPENDENT_AMBULATORY_CARE_PROVIDER_SITE_OTHER): Payer: Self-pay | Admitting: Geriatric Medicine

## 2016-12-24 ENCOUNTER — Ambulatory Visit (INDEPENDENT_AMBULATORY_CARE_PROVIDER_SITE_OTHER): Payer: 59 | Admitting: Geriatric Medicine

## 2016-12-24 VITALS — BP 120/74 | HR 76 | Temp 98.0°F | Resp 12 | Ht 62.0 in | Wt 126.0 lb

## 2016-12-24 DIAGNOSIS — Z6823 Body mass index (BMI) 23.0-23.9, adult: Secondary | ICD-10-CM

## 2016-12-24 DIAGNOSIS — M75101 Unspecified rotator cuff tear or rupture of right shoulder, not specified as traumatic: Secondary | ICD-10-CM

## 2016-12-24 DIAGNOSIS — M7541 Impingement syndrome of right shoulder: Secondary | ICD-10-CM

## 2016-12-24 MED ORDER — METHYLPREDNISOLONE 4 MG TABLETS IN A DOSE PACK: Tab | ORAL | 0 refills | 0 days | Status: DC

## 2016-12-24 MED ORDER — DICLOFENAC 2 % TOPICAL SOLUTION IN PACKET: 40 mg | Freq: Two times a day (BID) | CUTANEOUS | 0 refills | 0 days | Status: DC

## 2016-12-24 NOTE — Progress Notes (Signed)
Subjective:     Patient ID:  Marie DoughtySheri Renee Gutierrez is an 51 y.o. female     Chief Complaint:    Chief Complaint   Patient presents with   . Right Arm Pain       The history is provided by the patient and medical records. No language interpreter was used.     Marie Gutierrez is a 51 y.o. female who reports today with the c/o arm pain since over a month ago. Patient denies previous injury. Patient describes the pain as sharp and dull. Patient states that movement makes the pain worse and OTC Motrin and a heating pad makes the pain better. Patient rates her pain a 6/10. Patient has tried taking Motrin with complete relief. Pain travels to her neck and hands, no tingling/ numbness. It has limited her right shoulder ROM, she has been compensation her right arm work to left hand, no hx of recent injury/ rash.  Past Medical History  No current outpatient prescriptions on file.     No Known Allergies  Past Medical History:   Diagnosis Date   . HSV-2 infection          Past Surgical History:   Procedure Laterality Date   . HX NO SURGICAL PROCEDURES           Family Medical History:     Problem Relation (Age of Onset)    Hypertension Father            Social History     Social History   . Marital status: Divorced     Spouse name: N/A   . Number of children: N/A   . Years of education: N/A     Occupational History   .  [employer]     Social History Main Topics   . Smoking status: Never Smoker   . Smokeless tobacco: Never Used   . Alcohol use No   . Drug use: Not on file   . Sexual activity: Not on file     Other Topics Concern   . Not on file     Social History Narrative         Review of Systems   Constitutional: Positive for fatigue.   HENT: Negative.    Respiratory: Negative.    Cardiovascular: Negative.    Musculoskeletal: Positive for arthralgias, back pain and myalgias.   Hematological: Negative.        Objective:     BP 120/74  Pulse 76  Temp 36.7 C (98 F)  Resp 12  Ht 1.575 m (5\' 2" )  Wt 57.2 kg (126  lb)  LMP 04/18/2012  SpO2 99%  Breastfeeding? No  BMI 23.05 kg/m2         Physical Exam   Constitutional: She is oriented to person, place, and time. She appears well-developed and well-nourished.   HENT:   Head: Normocephalic and atraumatic.   Right Ear: External ear normal.   Left Ear: External ear normal.   Pulmonary/Chest: Effort normal.   Musculoskeletal:        Right shoulder: She exhibits decreased range of motion, tenderness, pain and spasm. She exhibits no swelling, no effusion, no crepitus, no deformity, no laceration, normal pulse and normal strength.        Arms:  Neurological: She is alert and oriented to person, place, and time. Coordination normal.   Nursing note and vitals reviewed.          Ortho Exam  Assessment & Plan:       ICD-10-CM    1. Rotator cuff syndrome, right M75.101 Methylprednisolone (MEDROL DOSEPACK) 4 mg Oral Tablets, Dose Pack     diclofenac sodium (PENNSAID) 2 % Apply externally Solution in Packet     Refer to Physical Therapy-External   2. Impingement syndrome of right shoulder region M75.41 Methylprednisolone (MEDROL DOSEPACK) 4 mg Oral Tablets, Dose Pack     Refer to Physical Therapy-External   3. BMI 23.0-23.9, adult Z68.23 BMI addressed: WNL, healthy diet

## 2016-12-24 NOTE — Nursing Note (Signed)
Marie Gutierrez is a 51 y.o. female who reports today with the c/o arm pain since over a month ago. Patient denies previous injury. Patient describes the pain as sharp and dull. Patient states that movement makes the pain worse and OTC Motrin and a heating pad makes the pain better. Patient rates her pain a 6/10. Patient has tried taking Motrin with complete relief. Marie Gutierrez, [place]

## 2016-12-29 ENCOUNTER — Other Ambulatory Visit: Payer: Self-pay

## 2017-06-17 ENCOUNTER — Encounter (INDEPENDENT_AMBULATORY_CARE_PROVIDER_SITE_OTHER): Payer: Self-pay | Admitting: Geriatric Medicine

## 2017-06-17 ENCOUNTER — Ambulatory Visit (INDEPENDENT_AMBULATORY_CARE_PROVIDER_SITE_OTHER): Payer: 59 | Admitting: Geriatric Medicine

## 2017-06-17 VITALS — BP 102/70 | HR 78 | Temp 97.9°F | Resp 12 | Ht 63.0 in | Wt 129.0 lb

## 2017-06-17 DIAGNOSIS — Z6822 Body mass index (BMI) 22.0-22.9, adult: Secondary | ICD-10-CM

## 2017-06-17 DIAGNOSIS — B009 Herpesviral infection, unspecified: Secondary | ICD-10-CM

## 2017-06-17 DIAGNOSIS — B078 Other viral warts: Secondary | ICD-10-CM

## 2017-06-17 HISTORY — PX: WART REMOVAL (CRYO), UP TO 14 LESIONS (AMB ONLY): 21000000205

## 2017-06-17 MED ORDER — ACYCLOVIR 400 MG TABLET
400.00 mg | ORAL_TABLET | Freq: Two times a day (BID) | ORAL | 11 refills | Status: DC
Start: 2017-06-17 — End: 2018-09-02

## 2017-06-17 NOTE — Progress Notes (Signed)
Subjective:     Patient ID:  Marie Gutierrez is an 52 y.o. female     Chief Complaint:    Chief Complaint   Patient presents with   . Wart   . Medication Reaction       Marie Gutierrez is a 52 y.o. female who reports today with the c/o a wart located on her right index finger.  First noted 1 month ago. Patient describes the rash as raised, rough and round. Patient denies itching/pain. Patient has tried putting duct tape without change.    She has history of HSV type 2 and takes Acyclovir with good results.      The history is provided by the patient and medical records. No language interpreter was used.       Past Medical History  Current Outpatient Medications   Medication Sig   . acyclovir (ZOVIRAX) 400 mg Oral Tablet Take 400 mg by mouth Twice daily     No Known Allergies  Past Medical History:   Diagnosis Date   . HSV-2 infection          Past Surgical History:   Procedure Laterality Date   . HX NO SURGICAL PROCEDURES           Family Medical History:     Problem Relation (Age of Onset)    Hypertension (High Blood Pressure) Father            Social History     Socioeconomic History   . Marital status: Divorced     Spouse name: Not on file   . Number of children: Not on file   . Years of education: Not on file   . Highest education level: Not on file   Occupational History     Employer: [employer]   Tobacco Use   . Smoking status: Never Smoker   . Smokeless tobacco: Never Used   Substance and Sexual Activity   . Alcohol use: No   Other Topics Concern         Review of Systems   Constitutional: Negative.    HENT: Negative.    Respiratory: Negative.    Cardiovascular: Negative.    Gastrointestinal: Negative.    Musculoskeletal: Negative.    Skin: Positive for rash.        +wart     Hematological: Negative.        Objective:     BP 102/70   Pulse 78   Temp 36.6 C (97.9 F) (Tympanic)   Resp 12   Ht 1.6 m ( )   Wt 58.5 kg (129 lb)   LMP 04/18/2012   SpO2 96%   Breastfeeding? No   BMI  22.85 kg/m          Physical Exam   Constitutional: She is oriented to person, place, and time. She appears well-developed and well-nourished.   HENT:   Head: Normocephalic and atraumatic.   Right Ear: External ear normal.   Left Ear: External ear normal.   Eyes: Conjunctivae are normal. No scleral icterus.   Neck: Normal range of motion.   Pulmonary/Chest: Effort normal.   Musculoskeletal: She exhibits no edema.        Hands:  Neurological: She is alert and oriented to person, place, and time.   Skin: Skin is warm. No rash noted. No erythema.   Wart of right 2nd digit.   Nursing note and vitals reviewed.        Ortho  Exam    Assessment & Plan:       ICD-10-CM    1. Common wart B07.8 Wart Removal (Cryo)-Up to 14 lesions[17110]   2. HSV-2 (herpes simplex virus 2) infection B00.9 acyclovir (ZOVIRAX) 400 mg Oral Tablet   3. BMI 22.0-22.9, adult Z68.22        Discussed 3 month treatment vs cryo.  Opted for cryo treatment in office today.    The treatments, side effects and failure rates are discussed.  Liquid nitrogen was applied to each wart.  The expected skin reaction, including erythema, pain, scabbing, blistering and hypopigmented scar formation, was discussed.  See at intervals until warts resolved. liquid nitrogen was applied twice, each time for 45 sec with 2 min interval . She tolerated the procedure well. No complication.   Apply OTC bacitracin and simple band aid.    Will fill medication for 1 year.    BMI addressed: BMI is WNL.  healthy diet and regular activity.  RTC as needed.

## 2017-06-17 NOTE — Procedures (Signed)
The treatments, side effects and failure rates are discussed.  Liquid nitrogen was applied to each wart.  The expected skin reaction, including erythema, pain, scabbing, blistering and hypopigmented scar formation, was discussed.  See at intervals until warts resolved. liquid nitrogen was applied twice, each time for 45 sec with 2 min interval . She tolerated the procedure well. No complication.   Apply OTC bacitracin and simple band aid.

## 2017-06-17 NOTE — Nursing Note (Signed)
Marie Gutierrez is a 52 y.o. female who reports today with the c/o a wart located on her right index finger. Patient first noticed the rash 1 month ago. Patient describes the rash as raised, rough and round. Patient denies itching/pain. Patient has tried putting duct tape but that did nothing. Fuller Mandril, [place]

## 2017-09-02 ENCOUNTER — Ambulatory Visit (INDEPENDENT_AMBULATORY_CARE_PROVIDER_SITE_OTHER): Payer: 59 | Admitting: Geriatric Medicine

## 2017-09-02 ENCOUNTER — Encounter (INDEPENDENT_AMBULATORY_CARE_PROVIDER_SITE_OTHER): Payer: Self-pay | Admitting: Geriatric Medicine

## 2017-09-02 VITALS — BP 106/70 | HR 113 | Temp 97.5°F | Resp 12 | Ht 62.5 in | Wt 127.0 lb

## 2017-09-02 DIAGNOSIS — R350 Frequency of micturition: Secondary | ICD-10-CM

## 2017-09-02 DIAGNOSIS — R300 Dysuria: Secondary | ICD-10-CM

## 2017-09-02 DIAGNOSIS — N3001 Acute cystitis with hematuria: Secondary | ICD-10-CM

## 2017-09-02 DIAGNOSIS — Z6822 Body mass index (BMI) 22.0-22.9, adult: Secondary | ICD-10-CM

## 2017-09-02 MED ORDER — SULFAMETHOXAZOLE 800 MG-TRIMETHOPRIM 160 MG TABLET
1.00 | ORAL_TABLET | Freq: Two times a day (BID) | ORAL | 0 refills | Status: DC
Start: 2017-09-02 — End: 2018-12-09

## 2017-09-02 NOTE — Progress Notes (Signed)
Subjective:     Patient ID:  Marie Gutierrez Marie Gutierrez is an 52 y.o. female     Chief Complaint:    Chief Complaint   Patient presents with   . Uti's       HPI  Marie Gutierrez Marie Gutierrez is a 52 y.o. female that reports today for UTI like symptoms for the past 5 days. Patient has the following symptoms; frequency, urgency, nausea, and only one time  vomiting, inability to void, subjective  fever and lower back pain. Pertinent negatives include no incontinence. Patient has tried increased fluid intake and cranberry juice for the symptoms with no relief.  Past Medical History  Current Outpatient Medications   Medication Sig   . acyclovir (ZOVIRAX) 400 mg Oral Tablet Take 1 Tab (400 mg total) by mouth Twice daily     No Known Allergies  Past Medical History:   Diagnosis Date   . HSV-2 infection          Past Surgical History:   Procedure Laterality Date   . HX NO SURGICAL PROCEDURES     . WART REMOVAL (CRYO), UP TO 14 LESIONS (AMB ONLY)  06/17/2017         Family Medical History:     Problem Relation (Age of Onset)    Hypertension (High Blood Pressure) Father            Social History     Socioeconomic History   . Marital status: Divorced     Spouse name: Not on file   . Number of children: Not on file   . Years of education: Not on file   . Highest education level: Not on file   Occupational History     Employer: [employer]   Tobacco Use   . Smoking status: Never Smoker   . Smokeless tobacco: Never Used   Substance and Sexual Activity   . Alcohol use: No   Other Topics Concern         Review of Systems   Constitutional: Positive for fatigue and fever.   HENT: Negative.    Respiratory: Negative.    Cardiovascular: Negative.    Gastrointestinal: Positive for nausea. Negative for abdominal distention, abdominal pain, anal bleeding and constipation.   Genitourinary: Positive for difficulty urinating, dysuria, frequency, hematuria and urgency. Negative for decreased urine volume, vaginal bleeding, vaginal discharge and  vaginal pain.   Musculoskeletal: Positive for back pain.   Hematological: Negative.        Objective:     BP 106/70   Pulse (!) 113   Temp 36.4 C (97.5 F) (Tympanic)   Resp 12   Ht 1.588 m (5' 2.5")   Wt 57.6 kg (127 lb)   LMP 04/18/2012   SpO2 99%   Breastfeeding? No   BMI 22.86 kg/m          Physical Exam   Constitutional: She is oriented to person, place, and time. She appears well-developed and well-nourished.   HENT:   Head: Normocephalic and atraumatic.   Right Ear: External ear normal.   Left Ear: External ear normal.   Eyes: Conjunctivae are normal. No scleral icterus.   Pulmonary/Chest: Effort normal.   Abdominal: Soft. Bowel sounds are normal. She exhibits no distension and no mass. There is no hepatosplenomegaly. There is no tenderness. There is no rigidity, no rebound, no guarding and no CVA tenderness. No hernia.   Neurological: She is alert and oriented to person, place, and time. She exhibits normal  muscle tone. Coordination normal.   Skin: Skin is warm. No cyanosis. Nails show no clubbing.   Nursing note and vitals reviewed.       09/02/17 1000   Urine   QC Urine yes   Time collected 1001   Glucose Negative   Bilirubin (!) 1+   Ketones Trace (5 mg/dl)   Urine Specific Gravity 1.025   Blood (urine) Large (3+)   pH 7.5   Protein (!) 1+ (30mg /dL)   Urobilinogen Normal    Nitrite Negative   Leukocytes (!) 3+   Initials HS           Ortho Exam    Assessment & Plan:       ICD-10-CM    1. Dysuria R30.0 POCT URINE DIPSTICK   2. Frequent urination R35.0 POCT URINE DIPSTICK   3. Acute cystitis with hematuria N30.01 trimethoprim-sulfamethoxazole (BACTRIM DS) 160-800mg  per tablet   4. BMI 22.0-22.9, adult Z68.22      Post-coital micturation strongly advised,always wipe back wards and keep vaginal area clean.  Drink plenty of fluids- especially cranberry juice.  BMI addressed: WNL, healthy diet

## 2017-09-02 NOTE — Nursing Note (Signed)
09/02/17 1000   Urine   QC Urine yes   Time collected 1001   Glucose Negative   Bilirubin (!) 1+   Ketones Trace (5 mg/dl)   Urine Specific Gravity 1.025   Blood (urine) Large (3+)   pH 7.5   Protein (!) 1+ (30mg /dL)   Urobilinogen Normal    Nitrite Negative   Leukocytes (!) 3+   Initials HS   Fuller MandrilHillary [place], [place]

## 2017-09-02 NOTE — Patient Instructions (Signed)
Post-coital micturation strongly advised,always wipe back wards and keep vaginal area clean.  Drink plenty of fluids- especially cranberry juice.

## 2017-09-02 NOTE — Nursing Note (Signed)
Marie Gutierrez is a 52 y.o. female that reports today for UTI like symptoms for the past 5 days. Patient has the following symptoms; frequency, urgency, nausea, vomiting, inability to void, chills, fever and lower back pain. Pertinent negatives include no incontinence. Patient has tried increased fluid intake and cranberry juice for the symptoms with no relief. Fuller MandrilHillary Randle Shatzer, [place]

## 2018-09-02 ENCOUNTER — Encounter (INDEPENDENT_AMBULATORY_CARE_PROVIDER_SITE_OTHER): Payer: Self-pay | Admitting: Geriatric Medicine

## 2018-09-02 ENCOUNTER — Other Ambulatory Visit: Payer: Self-pay

## 2018-09-02 ENCOUNTER — Telehealth (INDEPENDENT_AMBULATORY_CARE_PROVIDER_SITE_OTHER): Payer: 59 | Admitting: Geriatric Medicine

## 2018-09-02 VITALS — Temp 98.5°F | Resp 12 | Ht 62.0 in | Wt 133.0 lb

## 2018-09-02 DIAGNOSIS — Z6824 Body mass index (BMI) 24.0-24.9, adult: Secondary | ICD-10-CM

## 2018-09-02 DIAGNOSIS — B009 Herpesviral infection, unspecified: Secondary | ICD-10-CM

## 2018-09-02 DIAGNOSIS — Z8619 Personal history of other infectious and parasitic diseases: Secondary | ICD-10-CM

## 2018-09-02 MED ORDER — ACYCLOVIR 400 MG TABLET
400.00 mg | ORAL_TABLET | Freq: Two times a day (BID) | ORAL | 11 refills | Status: AC
Start: 2018-09-02 — End: ?

## 2018-09-02 NOTE — Progress Notes (Signed)
Subjective:     I have a pre existing relationship with this patient.  This patient was unable to come to the office due to COVID 19 pandemic and patient safety.  TELEMEDICINE DOCUMENTATION:    Patient Location:  MyChart video visit from home address: [address]  [place] [phone]    Patient/family aware of provider location:  yes  Patient/family consent for telemedicine:  yes  Examination observed and performed by:  Dennie BibleMehran Dream Harman, MD      Patient ID:  Marie Gutierrez is an 53 y.o. female     Chief Complaint:    Chief Complaint   Patient presents with   . Medication Check     acyclovir refill       The history is provided by the patient and medical records. No language interpreter was used.     Marie Gutierrez is a 53 y.o female presenting for medication management.  She has hx of HSV type 2 and takes Acyclovir BID with good control.  She denies any recent outbreak.  No medication side effect. No new or acute event since last visit     Past Medical History  Current Outpatient Medications   Medication Sig   . acyclovir (ZOVIRAX) 400 mg Oral Tablet Take 1 Tab (400 mg total) by mouth Twice daily     No Known Allergies  Past Medical History:   Diagnosis Date   . HSV-2 infection          Past Surgical History:   Procedure Laterality Date   . HX NO SURGICAL PROCEDURES     . WART REMOVAL (CRYO), UP TO 14 LESIONS (AMB ONLY)  06/17/2017         Family Medical History:     Problem Relation (Age of Onset)    Hypertension (High Blood Pressure) Father            Social History     Socioeconomic History   . Marital status: Divorced     Spouse name: Not on file   . Number of children: Not on file   . Years of education: Not on file   . Highest education level: Not on file   Occupational History     Employer: [employer]   Tobacco Use   . Smoking status: Never Smoker   . Smokeless tobacco: Never Used   Substance and Sexual Activity   . Alcohol use: No   Other Topics Concern         Review of Systems      Constitutional: Negative.    HENT: Negative.    Respiratory: Negative.    Cardiovascular: Negative.    Gastrointestinal: Negative.    Genitourinary: Negative.    Skin: Negative.    Neurological: Negative.    Hematological: Negative.        Objective:     Temp 36.9 C (98.5 F)   Resp 12   Ht 1.575 m (5\' 2" )   Wt 60.3 kg (133 lb)   LMP 04/18/2012   BMI 24.33 kg/m          Physical Exam  Vitals signs reviewed.   Constitutional:       General: She is not in acute distress.     Appearance: Normal appearance. She is normal weight. She is not ill-appearing, toxic-appearing or diaphoretic.   HENT:      Head: Normocephalic and atraumatic.      Right Ear: External ear normal.  Left Ear: External ear normal.   Eyes:      General: No scleral icterus.     Conjunctiva/sclera: Conjunctivae normal.   Neck:      Musculoskeletal: Normal range of motion and neck supple.   Pulmonary:      Effort: Pulmonary effort is normal.   Skin:     Coloration: Skin is not jaundiced.   Neurological:      Mental Status: She is alert and oriented to person, place, and time. Mental status is at baseline.   Psychiatric:         Mood and Affect: Mood normal.         Behavior: Behavior normal.         Thought Content: Thought content normal.         Ortho Exam    Assessment & Plan:       ICD-10-CM    1. HSV-2 (herpes simplex virus 2) infection B00.9 acyclovir (ZOVIRAX) 400 mg Oral Tablet   2. 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 BMI 24.0-24.9, adult Z68.24    The current medical regimen is effective;  continue present plan and medications.    BMI addressed: Advised on diet, weight loss, and exercise to reduce above normal BMI.  healthy diet  RTC 1 year or as needed.    I personally participated and evaluated the patient as part of an approved video/audiotelemedicine service.  See above note for additional information. The physical assessment, reviewed and interpreted all studies and the plan was mutually developed.

## 2018-10-08 ENCOUNTER — Other Ambulatory Visit: Payer: Self-pay

## 2018-12-09 ENCOUNTER — Telehealth (INDEPENDENT_AMBULATORY_CARE_PROVIDER_SITE_OTHER): Payer: 59 | Admitting: Geriatric Medicine

## 2018-12-09 ENCOUNTER — Encounter (INDEPENDENT_AMBULATORY_CARE_PROVIDER_SITE_OTHER): Payer: Self-pay | Admitting: Geriatric Medicine

## 2018-12-09 ENCOUNTER — Other Ambulatory Visit: Payer: Self-pay

## 2018-12-09 VITALS — Temp 98.0°F | Resp 15 | Ht 62.0 in | Wt 133.0 lb

## 2018-12-09 DIAGNOSIS — Z6824 Body mass index (BMI) 24.0-24.9, adult: Secondary | ICD-10-CM

## 2018-12-09 DIAGNOSIS — N3000 Acute cystitis without hematuria: Secondary | ICD-10-CM

## 2018-12-09 MED ORDER — SULFAMETHOXAZOLE 800 MG-TRIMETHOPRIM 160 MG TABLET
1.00 | ORAL_TABLET | Freq: Two times a day (BID) | ORAL | 0 refills | Status: AC
Start: 2018-12-09 — End: 2018-12-16

## 2018-12-09 NOTE — Nursing Note (Signed)
Marie Gutierrez is a 53 y.o female presenting with a possible UTI. Patient states symptoms started 12/07/18, and got worse 12/08/18. Patient states urination is painful. No itching, no burning, and no discharge. Patient states she is urinating more frequent. Patient has not taken anything OTC, and states when she drinks water it helps. Patient states nothing really makes it worse.     No other new concerns today. Gwinda Passe, [place]

## 2018-12-09 NOTE — Progress Notes (Signed)
Subjective:   I have a pre existing relationship with this patient.  This patient was unable to come to the office due to [place] 19 pandemic and patient safety.  TELEMEDICINE DOCUMENTATION:    Patient Location:  MyChart video visit from home address: [place] [id number]    Patient/family aware of provider location:  yes  Patient/family consent for telemedicine:  yes  Examination observed and performed by:  Kayleen Memos, MD    Patient ID:  Marie Gutierrez is an 53 y.o. female     Chief Complaint:    Chief Complaint   Patient presents with   . Urinary Tract Infection     facetime/possible uti       The history is provided by medical records and the patient. No language interpreter was used.   Marie Gutierrez is a 53 y.o female presenting with a possible UTI. Patient states symptoms started 12/07/18, and got worse 12/08/18. Patient states urination is painful. No itching, + burning, and no discharge. Patient states she is urinating more frequent. Patient has not taken anything OTC, and states when she drinks water it helps. Patient states nothing really makes it worse. Non bloody urine but urine is cloudy. Last UTI Aug 2019.    Past Medical History  Current Outpatient Medications   Medication Sig   . acyclovir (ZOVIRAX) 400 mg Oral Tablet Take 1 Tab (400 mg total) by mouth Twice daily   . trimethoprim-sulfamethoxazole (BACTRIM DS) 160-800mg  per tablet Take 1 Tab (160 mg total) by mouth Twice daily for 7 days     No Known Allergies  Past Medical History:   Diagnosis Date   . HSV-2 infection          Past Surgical History:   Procedure Laterality Date   . HX NO SURGICAL PROCEDURES     . WART REMOVAL ([facility]), UP TO 14 LESIONS (AMB ONLY)  06/17/2017         Family Medical History:     Problem Relation (Age of Onset)    Hypertension (High Blood Pressure) Father            Social History     Socioeconomic History   . Marital status: Divorced     Spouse name: Not on file   . Number of children: Not on file   .  Years of education: Not on file   . Highest education level: Not on file   Occupational History     Employer: [employer]   Tobacco Use   . Smoking status: Never Smoker   . Smokeless tobacco: Never Used   Substance and Sexual Activity   . Alcohol use: No   Other Topics Concern         Review of Systems   Constitutional: Positive for fatigue. Negative for chills, diaphoresis and fever.   HENT: Negative.    Respiratory: Negative.    Cardiovascular: Negative.    Gastrointestinal: Positive for abdominal pain. Negative for abdominal distention, anal bleeding, blood in stool, constipation, diarrhea, nausea, rectal pain and vomiting.   Genitourinary: Positive for dysuria, frequency and urgency. Negative for decreased urine volume, genital sores, hematuria, menstrual problem, pelvic pain, vaginal bleeding, vaginal discharge and vaginal pain.   Hematological: Negative.        Objective:     Temp 36.7 C (98 F)   Resp 15   Ht 1.575 m (5\' 2" )   Wt 60.3 kg (133 lb)   LMP 04/18/2012  BMI 24.33 kg/m          Physical Exam  Vitals signs reviewed.   Constitutional:       General: She is not in acute distress.     Appearance: Normal appearance. She is normal weight. She is not ill-appearing, toxic-appearing or diaphoretic.   HENT:      Head: Normocephalic and atraumatic.      Right Ear: External ear normal.      Left Ear: External ear normal.   Eyes:      General: No scleral icterus.     Conjunctiva/sclera: Conjunctivae normal.   Neck:      Musculoskeletal: Normal range of motion and neck supple.   Pulmonary:      Effort: Pulmonary effort is normal.   Skin:     Coloration: Skin is not jaundiced.   Neurological:      Mental Status: She is alert and oriented to person, place, and time. Mental status is at baseline.   Psychiatric:         Behavior: Behavior normal.         Thought Content: Thought content normal.         Ortho Exam    Assessment & Plan:       ICD-10-CM    1. Acute cystitis without hematuria  N30.00  trimethoprim-sulfamethoxazole (BACTRIM DS) 160-800mg  per tablet BID x 7 days    2. BMI 24.0-24.9, adult  Z68.24 BMI addressed: healthy diet       Post-coital micturation strongly advised,always wipe back wards and keep vaginal area clean.  Drink plenty of fluids- especially cranberry juice.    I personally participated and evaluated the patient as part of an approved video/audiotelemedicine service.  See above note for additional information. The physical assessment, reviewed and interpreted all studies and the plan was mutually developed.

## 2019-09-30 DIAGNOSIS — U071 COVID-19: Secondary | ICD-10-CM

## 2019-09-30 HISTORY — DX: COVID-19: U07.1

## 2019-11-23 ENCOUNTER — Other Ambulatory Visit (INDEPENDENT_AMBULATORY_CARE_PROVIDER_SITE_OTHER): Payer: Self-pay | Admitting: Geriatric Medicine

## 2019-11-23 DIAGNOSIS — B009 Herpesviral infection, unspecified: Secondary | ICD-10-CM

## 2019-11-23 NOTE — Telephone Encounter (Signed)
Pt is requesting courtesy fill. Rivers Gassmann, MA

## 2019-11-24 MED ORDER — ACYCLOVIR 400 MG TABLET
400.00 mg | ORAL_TABLET | Freq: Two times a day (BID) | ORAL | 0 refills | Status: DC
Start: 2019-11-24 — End: 2019-12-11

## 2019-12-11 ENCOUNTER — Other Ambulatory Visit: Payer: 59 | Attending: Geriatric Medicine

## 2019-12-11 ENCOUNTER — Other Ambulatory Visit: Payer: Self-pay

## 2019-12-11 ENCOUNTER — Ambulatory Visit (INDEPENDENT_AMBULATORY_CARE_PROVIDER_SITE_OTHER): Payer: 59 | Admitting: Geriatric Medicine

## 2019-12-11 ENCOUNTER — Encounter (INDEPENDENT_AMBULATORY_CARE_PROVIDER_SITE_OTHER): Payer: Self-pay | Admitting: Geriatric Medicine

## 2019-12-11 VITALS — BP 128/82 | HR 85 | Temp 98.0°F | Resp 12 | Ht 62.0 in | Wt 140.0 lb

## 2019-12-11 DIAGNOSIS — Z130 Encounter for screening for diseases of the blood and blood-forming organs and certain disorders involving the immune mechanism: Secondary | ICD-10-CM | POA: Insufficient documentation

## 2019-12-11 DIAGNOSIS — Z1329 Encounter for screening for other suspected endocrine disorder: Secondary | ICD-10-CM

## 2019-12-11 DIAGNOSIS — Z6825 Body mass index (BMI) 25.0-25.9, adult: Secondary | ICD-10-CM

## 2019-12-11 DIAGNOSIS — Z13220 Encounter for screening for lipoid disorders: Secondary | ICD-10-CM

## 2019-12-11 DIAGNOSIS — Z13228 Encounter for screening for other metabolic disorders: Secondary | ICD-10-CM | POA: Insufficient documentation

## 2019-12-11 DIAGNOSIS — Z131 Encounter for screening for diabetes mellitus: Secondary | ICD-10-CM | POA: Insufficient documentation

## 2019-12-11 DIAGNOSIS — Z1211 Encounter for screening for malignant neoplasm of colon: Secondary | ICD-10-CM

## 2019-12-11 DIAGNOSIS — B009 Herpesviral infection, unspecified: Secondary | ICD-10-CM

## 2019-12-11 DIAGNOSIS — Z0000 Encounter for general adult medical examination without abnormal findings: Secondary | ICD-10-CM

## 2019-12-11 DIAGNOSIS — Z1331 Encounter for screening for depression: Secondary | ICD-10-CM

## 2019-12-11 LAB — CBC WITH DIFF
BASOPHIL #: 0.1 10*3/uL (ref 0.00–0.10)
BASOPHIL %: 2 % (ref 0–3)
EOSINOPHIL #: 0.1 10*3/uL (ref 0.00–0.50)
EOSINOPHIL %: 2 % (ref 0–5)
HCT: 40.4 % (ref 36.0–45.0)
HGB: 13.5 g/dL (ref 12.0–15.5)
LYMPHOCYTE #: 1.5 10*3/uL (ref 1.00–4.80)
LYMPHOCYTE %: 28 % (ref 15–43)
MCH: 33.8 pg — ABNORMAL HIGH (ref 27.5–33.2)
MCHC: 33.5 g/dL (ref 32.0–36.0)
MCV: 100.9 fL — ABNORMAL HIGH (ref 82.0–97.0)
MONOCYTE #: 0.4 10*3/uL (ref 0.20–0.90)
MONOCYTE %: 8 % (ref 5–12)
MPV: 8.5 fL (ref 7.4–10.5)
NEUTROPHIL #: 3.3 10*3/uL (ref 1.50–6.50)
NEUTROPHIL %: 60 % (ref 43–76)
PLATELETS: 286 10*3/uL (ref 150–450)
RBC: 4.01 10*6/uL (ref 4.00–5.10)
RDW: 12.5 % (ref 11.0–16.0)
WBC: 5.5 10*3/uL (ref 4.0–11.0)

## 2019-12-11 LAB — LIPID PANEL
CHOL/HDL RATIO: 3.6
CHOLESTEROL: 219 mg/dL — ABNORMAL HIGH (ref 100–200)
HDL CHOL: 61 mg/dL (ref >=50)
LDL CALC: 136 mg/dL — ABNORMAL HIGH (ref <100)
NON-HDL: 158 mg/dL (ref <=190)
TRIGLYCERIDES: 111 mg/dL (ref <150)
VLDL CALC: 22 mg/dL (ref <30)

## 2019-12-11 LAB — COMPREHENSIVE METABOLIC PNL, FASTING
ALBUMIN: 3.9 g/dL (ref 3.5–5.0)
ALKALINE PHOSPHATASE: 107 U/L (ref 50–130)
ALT (SGPT): 34 U/L — ABNORMAL HIGH (ref 8–22)
ANION GAP: 7 mmol/L (ref 4–13)
AST (SGOT): 31 U/L (ref 8–45)
BILIRUBIN TOTAL: 0.7 mg/dL (ref 0.3–1.3)
BUN/CREA RATIO: 14 (ref 6–22)
BUN: 10 mg/dL (ref 8–25)
CALCIUM: 9 mg/dL (ref 8.5–10.0)
CHLORIDE: 100 mmol/L (ref 96–111)
CO2 TOTAL: 25 mmol/L (ref 22–30)
CREATININE: 0.71 mg/dL (ref 0.60–1.05)
ESTIMATED GFR: >90 mL/min/BSA (ref >=60)
GLUCOSE: 91 mg/dL (ref 70–99)
POTASSIUM: 4.2 mmol/L (ref 3.5–5.1)
PROTEIN TOTAL: 7.4 g/dL (ref 6.4–8.3)
SODIUM: 132 mmol/L — ABNORMAL LOW (ref 136–145)

## 2019-12-11 MED ORDER — ACYCLOVIR 400 MG TABLET
400.00 mg | ORAL_TABLET | Freq: Two times a day (BID) | ORAL | 3 refills | Status: DC
Start: 2019-12-11 — End: 2020-11-14

## 2019-12-11 NOTE — Progress Notes (Signed)
12/11/19 0841   Depression Screen   Little interest or pleasure in doing things. 0   Feeling down, depressed, or hopeless 0   PHQ 2 Total 0

## 2019-12-11 NOTE — Progress Notes (Signed)
Subjective:     Patient ID:  Marie Gutierrez is an 54 y.o. female     Chief Complaint:    Chief Complaint   Patient presents with   . Physical       The history is provided by the patient and medical records. No language interpreter was used.     WELL WOMAN CPE HPI  Marie Gutierrez is a 54 y.o. female here for a comprehensive physical exam.     She takes valtrex for HSV 2. No concerns.     She had mild case of covid 9/21. No ongoing concerns.       Depression Screen:  1. During the past month, have you often been bothered by feeling down, depressed, or hopeless? no  2. During the past month, have you often been bothered by little interest or pleasure in doing things? no    OB/Gyn History:  Patient receives gynecological care outside our clinic: no. She plans to establish with [facility] in [place]  Date of last pelvic exam: approx 7-8 years. normal  Date of last mammogram: declined  Date of last DEXA scan: n/a    Do you take any herbs or supplements that were not prescribed by a doctor? no  Are you taking any calcium supplements? no  Are you taking aspirin daily?no  Are you interested in HIV screening? no  Have you had the "Gardasil" cervical cancer vaccination? not applicable    Last Tetanus vaccine: 2016  Last Colonoscopy: none.   Last Influenza Vaccine: declined  Last Pneumonia vaccine:declined  Last Shingle vaccine: declined  COVID: declined    Functional/Nutritional Assessment (see questionnaire completed by nursing and reviewed by provider:yes      Past Medical History  Current Outpatient Medications   Medication Sig   . acyclovir (ZOVIRAX) 400 mg Oral Tablet Take 1 Tablet (400 mg total) by mouth Twice daily     No Known Allergies  Past Medical History:   Diagnosis Date   . COVID-19 09/30/2019   . HSV-2 infection          Past Surgical History:   Procedure Laterality Date   . HX NO SURGICAL PROCEDURES     . WART REMOVAL ([facility]), UP TO 14 LESIONS (AMB ONLY)  06/17/2017         Family Medical History:      Problem Relation (Age of Onset)    Hypertension (High Blood Pressure) Father          Social History     Socioeconomic History   . Marital status: Divorced     Spouse name: Not on file   . Number of children: Not on file   . Years of education: Not on file   . Highest education level: Not on file   Occupational History     Employer: [employer]   Tobacco Use   . Smoking status: Never Smoker   . Smokeless tobacco: Never Used   Substance and Sexual Activity   . Alcohol use: No   . Drug use: Never   Other Topics Concern     Social Determinants of Health     Financial Resource Strain: Low Risk    . Difficulty of Paying Living Expenses: Not hard at all   Food Insecurity: No Food Insecurity   . Worried About Programme researcher, broadcasting/film/video in the Last Year: Never true   . Ran Out of Food in the Last Year: Never true  Transportation Needs: No Transportation Needs   . Lack of Transportation (Medical): No   . Lack of Transportation (Non-Medical): No   Physical Activity: Inactive   . Days of Exercise per Week: 0 days   . Minutes of Exercise per Session: 0 min   Intimate Partner Violence: Not At Risk   . Fear of Current or Ex-Partner: No   . Emotionally Abused: No   . Physically Abused: No   . Sexually Abused: No         Review of Systems  Constitutional: Negative.    HENT: Negative.    Eyes: Negative.    Respiratory: Negative.    Cardiovascular: Negative.    Gastrointestinal: Negative.    Genitourinary: Negative.    Musculoskeletal: Negative.    Skin: Negative.    Neurological: Negative.    Endo/Heme/Allergies: Negative.    Psychiatric/Behavioral: Negative.    All other systems reviewed and are negative.      Objective:     BP 128/82   Pulse 85   Temp 36.7 C (98 F)   Resp 12   Ht 1.575 m (5\' 2" )   Wt 63.5 kg (140 lb)   LMP 04/18/2012   SpO2 98%   BMI 25.61 kg/m          Physical Exam    Constitutional: SHe is oriented to person, place, and time. SHe appears well-developed and well-nourished.   HENT:   Head:  Normocephalic and atraumatic.   Right Ear: Hearing, tympanic membrane, external ear and ear canal normal.   Left Ear: Hearing, tympanic membrane and external ear normal.   Nose: Nose normal.   Mouth/Throat: Uvula is midline, oropharynx is clear and moist and mucous membranes are normal. No oropharyngeal exudate.   Eyes: Conjunctivae, EOM and lids are normal. No scleral icterus.   Neck: Normal range of motion and full passive range of motion without pain. Neck supple. No JVD present. Carotid bruit is not present. No tracheal deviation present. No mass and no thyromegaly present.   Cardiovascular: Normal rate, regular rhythm, normal heart sounds and intact distal pulses.    Pulm:  Effort normal and breath sounds normal.  Observations:  no respiratory distress and no stridor.  The chest exhibits no tenderness.      Abdomen:   Normal appearance and bowel sounds are normal. SHe exhibits no distension. Soft. There is no hepatosplenomegaly. No tenderness. SHe has no rigidity, no rebound and no guarding.   Waist Circumference: <35 inches  Ortho/Musculoskeletal:   Normal range of motion. SHe exhibits no edema and no tenderness.   Lymphadenopathy:        Head (right side): No submental, no submandibular, no tonsillar, no preauricular, no posterior auricular and no occipital adenopathy present.        Head (left side): No submental, no submandibular, no tonsillar, no preauricular, no posterior auricular and no occipital adenopathy present.     SHe has no cervical adenopathy.     SHe has no axillary adenopathy.        Right: No inguinal and no supraclavicular adenopathy present.        Left: No inguinal and no supraclavicular adenopathy present.   Neurological: SHe is alert and oriented to person, place, and time. SHe has normal strength and normal reflexes. SHe displays no atrophy, no tremor and normal reflexes. No cranial nerve deficit or sensory deficit. SHe exhibits normal muscle tone. SHe displays a negative Romberg sign.  Coordination and gait normal. GCS eye  subscore is 4. GCS verbal subscore is 5. GCS motor subscore is 6.   Skin: Skin is warm. No rash noted. No cyanosis or erythema. Nails show no clubbing.   Psychiatric: SHe has a normal mood and affect. Her speech is normal and behavior is normal. Judgment and thought content normal. Cognition and memory are normal.   Nursing note and vitals reviewed.        Ortho Exam    Assessment & Plan:       ICD-10-CM    1. Annual physical exam  Z00.00 LIPID PANEL     COMPREHENSIVE METABOLIC PNL, FASTING     CBC/DIFF     OUTSIDE CONSULT/REFERRAL PROVIDER(AMB)     HGA1C (HEMOGLOBIN A1C WITH EST AVG GLUCOSE)     THYROID STIMULATING HORMONE WITH FREE T4 REFLEX   2. Depression screen  Z13.31    3. Screening, lipid  Z13.220 LIPID PANEL   4. BMI 25.0-25.9,adult  Z68.25    5. Screening, anemia, deficiency, iron  Z13.0 CBC/DIFF   6. Screening for metabolic disorder  Z13.228 COMPREHENSIVE METABOLIC PNL, FASTING   7. Screen for colon cancer  Z12.11 FECAL DNA TESTING (AMB)   8. Screening for diabetes mellitus  Z13.1 HGA1C (HEMOGLOBIN A1C WITH EST AVG GLUCOSE)   9. Screening for thyroid disorder  Z13.29 THYROID STIMULATING HORMONE WITH FREE T4 REFLEX   10. HSV-2 (herpes simplex virus 2) infection  B00.9 acyclovir (ZOVIRAX) 400 mg Oral Tablet     colon cancer screening, calcium/vitamin D, breast cancer screening, healthy diet and exercise, influnenza vaccination, pneumococcal vaccine (>9 years old or high risk patient), cholesterol, immunization, safety .     Declined further vaccine at this time.    Will concsider scheduling well woman. Referral provided. Declined mammogram at this time.    Declined colonoscopy. Agrees to consider Cologuard test in place.    BMI addressed: healthy diet  Depression screening : Follow up plan of care: Suicide risk assessment completed and patient  is not suicidal    RTC in one year or PRN

## 2019-12-12 LAB — HGA1C (HEMOGLOBIN A1C WITH EST AVG GLUCOSE)
ESTIMATED AVERAGE GLUCOSE: 105 mg/dL
HEMOGLOBIN A1C: 5.3 % (ref 4.0–5.6)

## 2020-10-20 ENCOUNTER — Other Ambulatory Visit: Payer: Self-pay

## 2020-11-14 ENCOUNTER — Other Ambulatory Visit (INDEPENDENT_AMBULATORY_CARE_PROVIDER_SITE_OTHER): Payer: Self-pay | Admitting: Geriatric Medicine

## 2020-11-14 DIAGNOSIS — B009 Herpesviral infection, unspecified: Secondary | ICD-10-CM

## 2020-11-14 MED ORDER — ACYCLOVIR 400 MG TABLET
400.0000 mg | ORAL_TABLET | Freq: Two times a day (BID) | ORAL | 0 refills | Status: DC
Start: 2020-11-14 — End: 2021-01-04

## 2021-01-04 ENCOUNTER — Ambulatory Visit (INDEPENDENT_AMBULATORY_CARE_PROVIDER_SITE_OTHER): Payer: 59 | Admitting: Geriatric Medicine

## 2021-01-04 ENCOUNTER — Encounter (INDEPENDENT_AMBULATORY_CARE_PROVIDER_SITE_OTHER): Payer: Self-pay | Admitting: Geriatric Medicine

## 2021-01-04 ENCOUNTER — Other Ambulatory Visit: Payer: Self-pay

## 2021-01-04 VITALS — BP 130/76 | HR 70 | Temp 97.6°F | Resp 15 | Ht 62.0 in | Wt 148.0 lb

## 2021-01-04 DIAGNOSIS — Z1331 Encounter for screening for depression: Secondary | ICD-10-CM

## 2021-01-04 DIAGNOSIS — Z13220 Encounter for screening for lipoid disorders: Secondary | ICD-10-CM

## 2021-01-04 DIAGNOSIS — Z1329 Encounter for screening for other suspected endocrine disorder: Secondary | ICD-10-CM

## 2021-01-04 DIAGNOSIS — B009 Herpesviral infection, unspecified: Secondary | ICD-10-CM

## 2021-01-04 DIAGNOSIS — Z131 Encounter for screening for diabetes mellitus: Secondary | ICD-10-CM

## 2021-01-04 DIAGNOSIS — Z0000 Encounter for general adult medical examination without abnormal findings: Secondary | ICD-10-CM

## 2021-01-04 DIAGNOSIS — Z1211 Encounter for screening for malignant neoplasm of colon: Secondary | ICD-10-CM

## 2021-01-04 DIAGNOSIS — Z7182 Exercise counseling: Secondary | ICD-10-CM

## 2021-01-04 DIAGNOSIS — Z6827 Body mass index (BMI) 27.0-27.9, adult: Secondary | ICD-10-CM

## 2021-01-04 MED ORDER — ACYCLOVIR 400 MG TABLET
400.0000 mg | ORAL_TABLET | Freq: Two times a day (BID) | ORAL | 3 refills | Status: AC
Start: 2021-01-04 — End: ?

## 2021-01-04 NOTE — Nursing Note (Signed)
WELL WOMAN CPE HPI  Marie Gutierrez  is a 55 y.o. female here for a comprehensive physical exam. Past Medical History has been reviewed, confirmed, and as follows: None.   History obtained from medical records and patient.     Depression Screen:  1. During the past month, have you often been bothered by feeling down, depressed, or hopeless? no  2. During the past month, have you often been bothered by little interest or pleasure in doing things? no      OB/Gyn History:  Patient receives gynecological care outside our clinic: None   Date of last pelvic exam: 7-8 years ago   Date of last mammogram: Declines   Date of last DEXA scan: N/a     Last Colonoscopy: None, declines. Cologuard given 2021, never completed.     Do you take any herbs or supplements that were not prescribed by a doctor? no   Are you taking any calcium supplements? no   Are you taking aspirin daily?no      Last Tetanus vaccine: Decline   Last Influenza Vaccine: Decline   Last Pneumonia vaccine: Decline   Last Shingle vaccine: Declines     Lab Results   Component Value Date    SODIUM 132 (L) 12/11/2019    POTASSIUM 4.2 12/11/2019    CHLORIDE 100 12/11/2019    CO2 25 12/11/2019    ANIONGAP 7 12/11/2019    BUN 10 12/11/2019    CREATININE 0.71 12/11/2019    GLUCOSENF 91 12/11/2019    CALCIUM 9.0 12/11/2019    ALBUMIN 3.9 12/11/2019    TOTALPROTEIN 7.4 12/11/2019    ALKPHOS 107 12/11/2019    AST 31 12/11/2019    ALT 34 (H) 12/11/2019     Lab Results   Component Value Date    CHOLESTEROL 219 (H) 12/11/2019    HDLCHOL 61 12/11/2019    LDLCHOL 136 (H) 12/11/2019    TRIG 111 12/11/2019

## 2021-01-04 NOTE — Progress Notes (Signed)
Subjective:     Patient ID:  Marie Gutierrez is an 55 y.o. female     Chief Complaint:    Chief Complaint   Patient presents with    Annual Exam       The history is provided by the patient and medical records. No language interpreter was used.   WELL WOMAN CPE HPI  Marie Gutierrez  is a 55 y.o. female here for a comprehensive physical exam. Past Medical History has been reviewed, confirmed, and as follows: None.   History obtained from medical records and patient.     Depression Screen:  1. During the past month, have you often been bothered by feeling down, depressed, or hopeless?no  2. During the past month, have you often been bothered by little interest or pleasure in doing things?no     OB/Gyn History:  Patient receives gynecological care outside our clinic: None   Date of last pelvic exam:8-9 years ago   Date of last mammogram:Declines   Date of last DEXA scan: N/a     Last Colonoscopy: None, declines. Cologuard given 2021, never completed.     Do you take any herbs or supplements that were not prescribed by a doctor?no   Are you taking any calcium supplements? no   Are you taking aspirin daily?no     Last Tetanus vaccine:2016  Last Influenza Vaccine:Decline   Last Pneumonia vaccine: Decline   Last Shingle vaccine:Declines   Immunization History   Administered Date(s) Administered    Tetanus,Diptheria,Pertussis(BOOSTRIX) 08/30/2014       Functional/Nutritional Assessment (see questionnaire completed by nursing and reviewed by provider:yes          Lab Results   Component Value Date    SODIUM 132 (L) 12/11/2019    POTASSIUM 4.2 12/11/2019    CHLORIDE 100 12/11/2019    CO2 25 12/11/2019    ANIONGAP 7 12/11/2019    BUN 10 12/11/2019    CREATININE 0.71 12/11/2019    GLUCOSENF 91 12/11/2019    CALCIUM 9.0 12/11/2019    ALBUMIN 3.9 12/11/2019    TOTALPROTEIN 7.4 12/11/2019    ALKPHOS 107 12/11/2019    AST 31 12/11/2019    ALT 34 (H) 12/11/2019           Lab Results   Component  Value Date    CHOLESTEROL 219 (H) 12/11/2019    HDLCHOL 61 12/11/2019    LDLCHOL 136 (H) 12/11/2019    TRIG 111 12/11/2019            Past Medical History  Current Outpatient Medications   Medication Sig    acyclovir (ZOVIRAX) 400 mg Oral Tablet Take 1 Tablet (400 mg total) by mouth Twice daily     No Known Allergies  Past Medical History:   Diagnosis Date    COVID-19 09/30/2019    HSV-2 infection          Past Surgical History:   Procedure Laterality Date    HX NO SURGICAL PROCEDURES      WART REMOVAL ([facility]), UP TO 14 LESIONS (AMB ONLY)  06/17/2017         Family Medical History:     Problem Relation (Age of Onset)    Hypertension (High Blood Pressure) Father          Social History     Socioeconomic History    Marital status: Divorced   Occupational History     Employer: [employer]   Tobacco Use  Smoking status: Never    Smokeless tobacco: Never   Substance and Sexual Activity    Alcohol use: No    Drug use: Never    Sexual activity: Not Currently     Social Determinants of Health     Financial Resource Strain: Low Risk     Difficulty of Paying Living Expenses: Not hard at all   Food Insecurity: No Food Insecurity    Worried About Programme researcher, broadcasting/film/video in the Last Year: Never true    Ran Out of Food in the Last Year: Never true   Transportation Needs: No Transportation Needs    Lack of Transportation (Medical): No    Lack of Transportation (Non-Medical): No   Physical Activity: Insufficiently Active    Days of Exercise per Week: 5 days    Minutes of Exercise per Session: 20 min   Intimate Partner Violence: Not At Risk    Fear of Current or Ex-Partner: No    Emotionally Abused: No    Physically Abused: No    Sexually Abused: No   Housing Stability: Low Risk     Unable to Pay for Housing in the Last Year: No    Number of Places Lived in the Last Year: 1    Unstable Housing in the Last Year: No         Review of Systems  Constitutional: Negative.    HENT: Negative.    Eyes:  Negative.    Respiratory: Negative.    Cardiovascular: Negative.    Gastrointestinal: Negative.    Genitourinary: Negative.    Musculoskeletal: Negative.    Skin: Negative.    Neurological: Negative.    Endo/Heme/Allergies: Negative.    Psychiatric/Behavioral: Negative.    All other systems reviewed and are negative.  Objective:     BP 130/76 (Site: Left, Patient Position: Sitting, Cuff Size: Adult)    Pulse 70    Temp 36.4 C (97.6 F) (Thermal Scan)    Resp 15    Ht 1.575 m (5\' 2" )    Wt 67.1 kg (148 lb)    LMP 04/18/2012    SpO2 96%    BMI 27.07 kg/m          Physical Exam  Constitutional: SHe is oriented to person, place, and time. SHe appears well-developed and well-nourished.   HENT:   Head: Normocephalic and atraumatic.   Right Ear: Hearing, tympanic membrane, external ear and ear canal normal.   Left Ear: Hearing, tympanic membrane and external ear normal.   Nose: Nose normal.   Mouth/Throat: Uvula is midline, oropharynx is clear and moist and mucous membranes are normal. No oropharyngeal exudate.   Eyes: Conjunctivae, EOM and lids are normal. No scleral icterus.   Neck: Normal range of motion and full passive range of motion without pain. Neck supple. No JVD present. Carotid bruit is not present. No tracheal deviation present. No mass and no thyromegaly present.   Cardiovascular: Normal rate, regular rhythm, normal heart sounds and intact distal pulses.    Pulm:  Effort normal and breath sounds normal.  Observations:  no respiratory distress and no stridor.  The chest exhibits no tenderness.      Abdomen:   Normal appearance and bowel sounds are normal. SHe exhibits no distension. Soft. There is no hepatosplenomegaly. No tenderness. SHe has no rigidity, no rebound and no guarding.   Waist Circumference: <35 inches  Ortho/Musculoskeletal:   Normal range of motion. SHe exhibits no edema and no tenderness.  Lymphadenopathy:        Head (right side): No submental, no submandibular, no tonsillar, no  preauricular, no posterior auricular and no occipital adenopathy present.        Head (left side): No submental, no submandibular, no tonsillar, no preauricular, no posterior auricular and no occipital adenopathy present.     SHe has no cervical adenopathy.     SHe has no axillary adenopathy.        Right: No inguinal and no supraclavicular adenopathy present.        Left: No inguinal and no supraclavicular adenopathy present.   Neurological: SHe is alert and oriented to person, place, and time. SHe has normal strength and normal reflexes. SHe displays no atrophy, no tremor and normal reflexes. No cranial nerve deficit or sensory deficit. SHe exhibits normal muscle tone. SHe displays a negative Romberg sign. Coordination and gait normal. GCS eye subscore is 4. GCS verbal subscore is 5. GCS motor subscore is 6.   Skin: Skin is warm. No rash noted. No cyanosis or erythema. Nails show no clubbing.   Psychiatric: SHe has a normal mood and affect. Her speech is normal and behavior is normal. Judgment and thought content normal. Cognition and memory are normal.   Nursing note and vitals reviewed.    Ortho Exam    Assessment & Plan:       ICD-10-CM    1. Annual physical exam  Z00.00 THYROID STIMULATING HORMONE WITH FREE T4 REFLEX     HGA1C (HEMOGLOBIN A1C WITH EST AVG GLUCOSE)     LIPID PANEL     COMPREHENSIVE METABOLIC PNL, FASTING      2. Depression screen  Z13.31       3. Screening for diabetes mellitus  Z13.1 HGA1C (HEMOGLOBIN A1C WITH EST AVG GLUCOSE)     COMPREHENSIVE METABOLIC PNL, FASTING      4. Exercise counseling  Z71.82       5. Screening, lipid  Z13.220 LIPID PANEL     COMPREHENSIVE METABOLIC PNL, FASTING      6. Screen for colon cancer  Z12.11 FIT (FECAL IMMUNOCHEMICAL TESTING)      7. Screening for thyroid disorder  Z13.29 THYROID STIMULATING HORMONE WITH FREE T4 REFLEX      8. Hx HSV-2 (herpes simplex virus 2) infection  B00.9 acyclovir (ZOVIRAX) 400 mg Oral Tablet      9. BMI 27.0-27.9,adult  Z68.27          colon cancer screening, calcium/vitamin D, breast cancer screening, healthy diet and exercise, influnenza vaccination, pneumococcal vaccine (>60 years old or high risk patient), cholesterol, immunization, safety .   Declined further vaccine at this time.    Will concsider scheduling well woman. Referral provided. Declined mammogram at this time.    Declined colonoscopy. Agrees to consider FIT in place.    Depression screening is negative . Follow up plan of care: Suicide risk assessment completed and patient  is not suicidal  BMI addressed: healthy diet  RTC in one year or PRN

## 2021-06-16 ENCOUNTER — Encounter (INDEPENDENT_AMBULATORY_CARE_PROVIDER_SITE_OTHER): Payer: Self-pay | Admitting: Geriatric Medicine

## 2021-06-16 ENCOUNTER — Other Ambulatory Visit: Payer: Self-pay

## 2021-06-16 ENCOUNTER — Ambulatory Visit (INDEPENDENT_AMBULATORY_CARE_PROVIDER_SITE_OTHER): Payer: 59 | Admitting: Geriatric Medicine

## 2021-06-16 VITALS — BP 126/76 | HR 79 | Ht 62.0 in | Wt 136.6 lb

## 2021-06-16 DIAGNOSIS — K219 Gastro-esophageal reflux disease without esophagitis: Secondary | ICD-10-CM

## 2021-06-16 DIAGNOSIS — K30 Functional dyspepsia: Secondary | ICD-10-CM

## 2021-06-16 DIAGNOSIS — Z6824 Body mass index (BMI) 24.0-24.9, adult: Secondary | ICD-10-CM

## 2021-06-16 MED ORDER — PANTOPRAZOLE 40 MG TABLET,DELAYED RELEASE
40.0000 mg | DELAYED_RELEASE_TABLET | Freq: Every day | ORAL | 1 refills | Status: DC | PRN
Start: 2021-06-16 — End: 2021-08-14

## 2021-06-16 NOTE — Progress Notes (Signed)
Subjective:     Patient ID:  Marie Gutierrez is an 56 y.o. female     Chief Complaint:    Chief Complaint   Patient presents with   . Abdominal Pain     The history is provided by the patient and medical records. No language interpreter was used.     Marie Gutierrez is a 56 y.o. female is presenting today for GI pain.   Patient is taking all medications as instructed without any side effects.     Patient is complaining of acid reflux x1 month. Reports alot of pressure/heaviness in her chest, abdominal discomfort in epigastric region, nausea with a sour stomach taste, and with diarrhea > loose stools. Burning in the chest, stomach, and throat. Describes heaviness in chest as if she has trapped gas, will use Gas-x and will relieve the heaviness after a few hours. Patient reports feeling " not herself after eating, constant stomach ache."   Denies fever, chills, C/P or SOB, vomiting, blood in stool, bloating.   Patient has tried  OTC famotidine 20mg , Gasx     Past Medical History  Current Outpatient Medications   Medication Sig   . acyclovir (ZOVIRAX) 400 mg Oral Tablet Take 1 Tablet (400 mg total) by mouth Twice daily     No Known Allergies  Past Medical History:   Diagnosis Date   . COVID-19 09/30/2019   . HSV-2 infection      Past Surgical History:   Procedure Laterality Date   . HX NO SURGICAL PROCEDURES     . WART REMOVAL ([facility]), UP TO 14 LESIONS (AMB ONLY)  06/17/2017     Family Medical History:     Problem Relation (Age of Onset)    Hypertension (High Blood Pressure) Father        Social History     Socioeconomic History   . Marital status: Divorced   Occupational History     Employer: [employer]   Tobacco Use   . Smoking status: Never   . Smokeless tobacco: Never   Substance and Sexual Activity   . Alcohol use: No   . Drug use: Never   . Sexual activity: Not Currently     Review of Systems   Constitutional: Positive for fatigue. Negative for chills, diaphoresis and fever.   HENT: Negative.     Respiratory: Negative.    Cardiovascular: Negative.    Gastrointestinal: Positive for abdominal pain. Negative for abdominal distention, anal bleeding, blood in stool, constipation, diarrhea, nausea, rectal pain and vomiting.   Genitourinary: Negative.    Skin: Negative.    Neurological: Negative.    Hematological: Negative.      Objective:     BP 126/76   Pulse 79   Ht 1.575 m (5\' 2" )   Wt 62 kg (136 lb 9.6 oz)   LMP 04/12/2012   SpO2 96%   BMI 24.98 kg/m     Physical Exam  Vitals and nursing note reviewed.   Constitutional:       General: She is not in acute distress.     Appearance: Normal appearance. She is not ill-appearing, toxic-appearing or diaphoretic.   HENT:      Head: Normocephalic and atraumatic.      Left Ear: External ear normal.   Eyes:      General: No scleral icterus.     Conjunctiva/sclera: Conjunctivae normal.   Cardiovascular:      Rate and Rhythm: Normal rate and regular rhythm.  Pulses: Normal pulses.   Pulmonary:      Effort: Pulmonary effort is normal.      Breath sounds: Normal breath sounds.   Abdominal:      General: Abdomen is flat. Bowel sounds are normal. There is no distension.      Palpations: Abdomen is soft. There is no mass.      Tenderness: There is no abdominal tenderness. There is no right CVA tenderness, left CVA tenderness, guarding or rebound.      Hernia: No hernia is present.   Musculoskeletal:      Cervical back: Normal range of motion and neck supple.   Neurological:      Mental Status: She is alert and oriented to person, place, and time.   Psychiatric:         Behavior: Behavior normal.         Thought Content: Thought content normal.         Ortho Exam    Assessment & Plan:       ICD-10-CM    1. Acid reflux  K21.9 Unable to tolerate Pepcid---> diarrhea   Will start pantoprazole (PROTONIX) 40 mg Oral Tablet, Delayed Release (E.C.) daily PRN  If not better in 2-3 weeks she will schedule for following tests.      HELICOBACTER PYLORI AG, BY EIA     FLUORO  ESOPHAGRAM (BA SWALLOW)  Avoid any kind of food that triggers the acid reflux. Including heavy meals/ spicy foods and greasy food       2. Indigestion  K30 pantoprazole (PROTONIX) 40 mg Oral Tablet, Delayed Release (E.C.)     HELICOBACTER PYLORI AG, BY EIA     FLUORO ESOPHAGRAM (BA SWALLOW)      3. BMI 24.0-24.9, adult  Z68.24 BMI addressed: healthy diet

## 2021-06-16 NOTE — Nursing Note (Signed)
Marie Gutierrez is a 56 y.o. female is presenting today for GI pain.   Patient is taking all medications as instructed without any side effects.     Patient is complaining of acid reflux with burning in the throat, alot pf pressure in her chest, abdominal discomfort, nausea with a sour stomach taste, and with diarrhea with loose stools. Burning in the chest, stomach, and throat.  Patient reports feeling "down and not herself after eating." Denies fever, chills, SOB, blood in stool. Patient has tried  OTC famotidine 20mg , Gasx

## 2021-06-20 ENCOUNTER — Other Ambulatory Visit: Payer: Self-pay

## 2021-06-20 ENCOUNTER — Other Ambulatory Visit: Payer: 59 | Attending: Geriatric Medicine

## 2021-06-20 DIAGNOSIS — Z0000 Encounter for general adult medical examination without abnormal findings: Secondary | ICD-10-CM | POA: Insufficient documentation

## 2021-06-20 DIAGNOSIS — Z13220 Encounter for screening for lipoid disorders: Secondary | ICD-10-CM | POA: Insufficient documentation

## 2021-06-20 DIAGNOSIS — Z131 Encounter for screening for diabetes mellitus: Secondary | ICD-10-CM | POA: Insufficient documentation

## 2021-06-20 DIAGNOSIS — Z1329 Encounter for screening for other suspected endocrine disorder: Secondary | ICD-10-CM | POA: Insufficient documentation

## 2021-06-20 LAB — LIPID PANEL
CHOL/HDL RATIO: 3.8
CHOLESTEROL: 219 mg/dL — ABNORMAL HIGH (ref 100–200)
HDL CHOL: 57 mg/dL (ref >=50)
LDL CALC: 138 mg/dL — ABNORMAL HIGH (ref <100)
NON-HDL: 162 mg/dL (ref <=190)
TRIGLYCERIDES: 137 mg/dL (ref <150)
VLDL CALC: 25 mg/dL (ref <30)

## 2021-06-20 LAB — COMPREHENSIVE METABOLIC PNL, FASTING
ALBUMIN: 4.2 g/dL (ref 3.5–5.0)
ALKALINE PHOSPHATASE: 102 U/L (ref 50–130)
ALT (SGPT): 24 U/L — ABNORMAL HIGH (ref 8–22)
ANION GAP: 9 mmol/L (ref 4–13)
AST (SGOT): 21 U/L (ref 8–45)
BILIRUBIN TOTAL: 0.5 mg/dL (ref 0.3–1.3)
BUN/CREA RATIO: 12 (ref 6–22)
BUN: 9 mg/dL (ref 8–25)
CALCIUM: 9.7 mg/dL (ref 8.5–10.0)
CHLORIDE: 107 mmol/L (ref 96–111)
CO2 TOTAL: 25 mmol/L (ref 22–30)
CREATININE: 0.73 mg/dL (ref 0.60–1.05)
ESTIMATED GFR: >90 mL/min/BSA (ref >=60)
GLUCOSE: 109 mg/dL — ABNORMAL HIGH (ref 70–99)
POTASSIUM: 4.8 mmol/L (ref 3.5–5.1)
PROTEIN TOTAL: 7.7 g/dL (ref 6.4–8.3)
SODIUM: 141 mmol/L (ref 136–145)

## 2021-06-20 LAB — THYROID STIMULATING HORMONE WITH FREE T4 REFLEX: TSH: 1.177 u[IU]/mL (ref 0.430–3.550)

## 2021-06-20 LAB — HGA1C (HEMOGLOBIN A1C WITH EST AVG GLUCOSE)
ESTIMATED AVERAGE GLUCOSE: 97 mg/dL
HEMOGLOBIN A1C: 5 % (ref 4.0–5.6)

## 2021-06-21 ENCOUNTER — Encounter (HOSPITAL_COMMUNITY): Payer: Self-pay

## 2021-06-28 ENCOUNTER — Encounter (HOSPITAL_COMMUNITY): Payer: Self-pay

## 2021-07-03 ENCOUNTER — Encounter (HOSPITAL_COMMUNITY): Payer: Self-pay

## 2021-08-14 ENCOUNTER — Other Ambulatory Visit (INDEPENDENT_AMBULATORY_CARE_PROVIDER_SITE_OTHER): Payer: Self-pay | Admitting: Geriatric Medicine

## 2021-08-14 DIAGNOSIS — K219 Gastro-esophageal reflux disease without esophagitis: Secondary | ICD-10-CM

## 2021-08-14 DIAGNOSIS — K30 Functional dyspepsia: Secondary | ICD-10-CM

## 2021-08-15 ENCOUNTER — Other Ambulatory Visit (INDEPENDENT_AMBULATORY_CARE_PROVIDER_SITE_OTHER): Payer: Self-pay | Admitting: Geriatric Medicine

## 2021-08-15 DIAGNOSIS — K219 Gastro-esophageal reflux disease without esophagitis: Secondary | ICD-10-CM

## 2021-08-15 DIAGNOSIS — K30 Functional dyspepsia: Secondary | ICD-10-CM

## 2021-11-09 ENCOUNTER — Ambulatory Visit (INDEPENDENT_AMBULATORY_CARE_PROVIDER_SITE_OTHER): Payer: No Typology Code available for payment source | Admitting: Family

## 2021-11-09 ENCOUNTER — Encounter (INDEPENDENT_AMBULATORY_CARE_PROVIDER_SITE_OTHER): Payer: Self-pay | Admitting: Family

## 2021-11-09 VITALS — BP 152/82 | HR 79 | Temp 98.2°F | Resp 18 | Ht 62.0 in | Wt 149.6 lb

## 2021-11-09 DIAGNOSIS — B001 Herpesviral vesicular dermatitis: Secondary | ICD-10-CM

## 2021-11-09 DIAGNOSIS — Z1159 Encounter for screening for other viral diseases: Secondary | ICD-10-CM

## 2021-11-09 DIAGNOSIS — R42 Dizziness and giddiness: Secondary | ICD-10-CM

## 2021-11-09 DIAGNOSIS — Z1231 Encounter for screening mammogram for malignant neoplasm of breast: Secondary | ICD-10-CM

## 2021-11-09 DIAGNOSIS — Z13220 Encounter for screening for lipoid disorders: Secondary | ICD-10-CM

## 2021-11-09 DIAGNOSIS — Z1211 Encounter for screening for malignant neoplasm of colon: Secondary | ICD-10-CM

## 2021-11-09 DIAGNOSIS — R030 Elevated blood-pressure reading, without diagnosis of hypertension: Secondary | ICD-10-CM

## 2021-11-09 DIAGNOSIS — R002 Palpitations: Secondary | ICD-10-CM

## 2021-11-09 DIAGNOSIS — K219 Gastro-esophageal reflux disease without esophagitis: Secondary | ICD-10-CM

## 2021-11-09 MED ORDER — ACYCLOVIR 400 MG PO TABS
400.0000 mg | ORAL_TABLET | Freq: Two times a day (BID) | ORAL | 11 refills | Status: DC
Start: ? — End: 2021-11-09

## 2021-11-09 NOTE — Progress Notes (Signed)
Name: Courtney Fischer                          MRN: [id number]  Date of Service: 11/09/2021        CC:   Chief Complaint   Patient presents with    Establish Care    Palpitations     For about 2 months     Hypertension       Subjective:    Patient presented in office today as a new patient to establish care and for evaluation for palpitations and elevated blood pressure.     She reports that she has had palpitations, occurring twice per week. She notes that she feels like her HR is skipping and she has to sit down because she will feel dizzy and feel like she is going to pass out. Other times she feels like HR is racing and she sits down to calm down. Symptoms can occur with rest and exertion. She reports that her bp has been elevated as well. History of htn in the past that resolved after stopping birth control pills. Other symptoms include shortness of breath and dizziness intermittently. She denies any chest pain, leg swelling, syncope.      06/21/2018 CMP was within normal limits except for glucose 109, ALT 24.  Her A1c was 5.0.  TSH was normal at 1.17.  Total cholesterol 219, triglycerides 137, HDL 57, LDL 138.    She was was started on omeprazole 20 mg daily for GERD in may 2023, well controlled. 11/19/13 h pylori negative. She had previously tried famotidine, and Gas X.     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 She notes that she has large cold sores on her lips when she has outbreaks. She notes that she has been on this for a couple years, well controlled with medication.     Health Maintenance:  Diet: she eats out 2-3 times per week but does try to eat at home.   Exercise: none   Water: drinks plenty of water.   Caffeine: none, was previously drinking caffeine but stopped after palpitations started.   Sleep: 6-8 hours of sleep per night.      Social:  Lives with: by herself and dog.   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   drugs: none   Tobacco: not a current or former  smoker.   ETOH: none     No Known Allergies  History reviewed. No pertinent past medical history.  Current Outpatient Medications on File Prior to Visit   Medication Sig Dispense Refill    omeprazole (PriLOSEC) 20 MG capsule Take 1 capsule (20 mg) by mouth daily      [DISCONTINUED] acyclovir (ZOVIRAX) 400 MG tablet Take 1 tablet (400 mg) by mouth 2 (two) times daily       No current facility-administered medications on file prior to visit.     History reviewed. No pertinent surgical history.   Social History     Tobacco Use    Smoking status: Never    Smokeless tobacco: Never   Vaping Use    Vaping Use: Never used   Substance Use Topics    Alcohol use: Never    Drug use: Never       Cervical cancer screening:   Last pap over 10 years ago, no history of abnormal paps in the past. No hysterectomy.   Lmp was late 65s.  Breast cancer screening:   No family history of breast cancer. She has never had a mammogram, would like to get one.     Colorectal cancer screening:   No family history of colon cancer. Denies any stool changes, pain, blood in stool. She has not had colon cancer screening in the past.     Hepatitis C screening:   Has not had in the past.     Osteoporosis screening:   Has not had dexa scan in the past. Postmenopausal.      *Immunizations     Immunization History   Administered Date(s) Administered    Tdap 08/30/2014       Shingles: declines     Covid: declines     Influenza:  declines        Objective:    BP 152/82   Pulse 79   Temp 98.2 F (36.8 C) (Oral)   Resp 18   Ht 1.575 m (5\' 2" )   Wt 67.9 kg (149 lb 9.6 oz)   SpO2 100%   BMI 27.36 kg/m     Physical Exam  Constitutional:       General: She is not in acute distress.     Appearance: Normal appearance. She is not ill-appearing.   HENT:      Head: Normocephalic and atraumatic.      Right Ear: Tympanic membrane normal.      Left Ear: Tympanic membrane normal.      Nose: Nose normal.      Mouth/Throat:      Mouth: Mucous membranes are moist.       Pharynx: Oropharynx is clear. No oropharyngeal exudate or posterior oropharyngeal erythema.   Eyes:      General: No scleral icterus.        Right eye: No discharge.         Left eye: No discharge.      Conjunctiva/sclera: Conjunctivae normal.      Pupils: Pupils are equal, round, and reactive to light.   Neck:      Thyroid: No thyromegaly.      Vascular: No carotid bruit.   Cardiovascular:      Rate and Rhythm: Normal rate and regular rhythm.      Pulses: Normal pulses.      Heart sounds: Normal heart sounds. No murmur heard.  Pulmonary:      Effort: Pulmonary effort is normal. No respiratory distress.      Breath sounds: Normal breath sounds.   Abdominal:      General: Bowel sounds are normal.      Palpations: Abdomen is soft.      Tenderness: There is no abdominal tenderness.   Musculoskeletal:         General: Normal range of motion.      Cervical back: Normal range of motion and neck supple.   Lymphadenopathy:      Cervical: No cervical adenopathy.   Skin:     General: Skin is warm and dry.      Capillary Refill: Capillary refill takes less than 2 seconds.   Neurological:      General: No focal deficit present.      Mental Status: She is alert and oriented to person, place, and time. Mental status is at baseline.   Psychiatric:         Mood and Affect: Mood normal.         Behavior: Behavior normal.  Thought Content: Thought content normal.         Judgment: Judgment normal.          Assessment/Plan:    Encounter Diagnoses   Name Primary?    Palpitations Yes    Dizziness     Elevated blood pressure reading     Gastroesophageal reflux disease, unspecified whether esophagitis present     Herpes labialis     Lipid screening     Encounter for screening mammogram for malignant neoplasm of breast     Encounter for hepatitis C screening test for low risk patient     Colon cancer screening        *Orders as below     Orders Placed This Encounter    Mammo Screening 3D/Tomo Bilateral    CBC and differential     Comprehensive metabolic panel    Lipid panel    TSH, Abn Reflex to Free T4, Serum    VH Cologuard    Hepatitis C (HCV) antibody, Total    POCT (EKG) ECG 12 lead    Holter Monitor - 30 Day (All Except [facility])    DISCONTD: acyclovir (ZOVIRAX) 400 MG tablet    omeprazole (PriLOSEC) 20 MG capsule    acyclovir (ZOVIRAX) 400 MG tablet     Palpitations, dizziness, elevated bp: EKG in office today showed NSR, rate 81, no signs of ischemia. Ordered labs, 7 day holter monitor. She is going to continue to monitor bp at home and we will recheck in two weeks after labs completed.   GERD: well controlled with omeprazole 20 mg daily.   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 Refilled.   Mammogram ordered for breast cancer screening.  Cologuard ordered for colon cancer screening.  Hep C antibody ordered for screening.    Follow up:  2 weeks or call office sooner with any worsening symptoms or concerns.     Johnn Hai, NP

## 2021-11-21 ENCOUNTER — Other Ambulatory Visit
Admission: RE | Admit: 2021-11-21 | Discharge: 2021-11-21 | Disposition: A | Discharge status: Home / Self Care | Payer: No Typology Code available for payment source | Source: Ambulatory Visit | Attending: Family | Admitting: Family

## 2021-11-21 DIAGNOSIS — Z1159 Encounter for screening for other viral diseases: Secondary | ICD-10-CM

## 2021-11-21 DIAGNOSIS — R002 Palpitations: Secondary | ICD-10-CM

## 2021-11-21 DIAGNOSIS — R42 Dizziness and giddiness: Secondary | ICD-10-CM

## 2021-11-21 DIAGNOSIS — Z13220 Encounter for screening for lipoid disorders: Secondary | ICD-10-CM

## 2021-11-21 LAB — CBC AND DIFFERENTIAL
Basophils %: 1.3 % (ref 0.0–3.0)
Basophils Absolute: 0.1 10*3/uL (ref 0.0–0.3)
Eosinophils %: 1.8 % (ref 0.0–7.0)
Eosinophils Absolute: 0.1 10*3/uL (ref 0.0–0.8)
Hematocrit: 42.5 % (ref 36.0–48.0)
Hemoglobin: 13.6 gm/dL (ref 12.0–16.0)
Lymphocytes Absolute: 1.5 10*3/uL (ref 0.6–5.1)
Lymphocytes: 24.9 % (ref 15.0–46.0)
MCH: 33 pg (ref 28–35)
MCHC: 32 gm/dL (ref 32–36)
MCV: 102 fL — ABNORMAL HIGH (ref 80–100)
Monocytes Absolute: 0.4 10*3/uL (ref 0.1–1.7)
Monocytes: 7.4 % (ref 3.0–15.0)
Neutrophils %: 64.6 % (ref 42.0–78.0)
Neutrophils Absolute: 3.8 10*3/uL (ref 1.7–8.6)
PLT CT: 250 10*3/uL (ref 130–440)
RBC: 4.17 10*6/uL (ref 3.80–5.00)
RDW: 11.3 % (ref 11.0–14.0)
WBC: 5.8 10*3/uL (ref 4.0–11.0)

## 2021-11-21 LAB — COMPREHENSIVE METABOLIC PANEL
ALT: 90 U/L — ABNORMAL HIGH (ref 0–55)
AST (SGOT): 52 U/L — ABNORMAL HIGH (ref 10–42)
Albumin/Globulin Ratio: 1.25 Ratio (ref 0.80–2.00)
Albumin: 4 gm/dL (ref 3.5–5.0)
Alkaline Phosphatase: 124 U/L (ref 40–145)
Anion Gap: 15.5 mMol/L (ref 7.0–18.0)
BUN / Creatinine Ratio: 18.1 Ratio (ref 10.0–30.0)
BUN: 13 mg/dL (ref 7–22)
Bilirubin, Total: 0.9 mg/dL (ref 0.1–1.2)
CO2: 25 mMol/L (ref 20–30)
Calcium: 9.2 mg/dL (ref 8.5–10.5)
Chloride: 106 mMol/L (ref 98–110)
Creatinine: 0.72 mg/dL (ref 0.60–1.20)
EGFR: 98 mL/min/{1.73_m2} (ref 60–150)
Globulin: 3.2 gm/dL (ref 2.0–4.0)
Glucose: 103 mg/dL — ABNORMAL HIGH (ref 71–99)
Osmolality Calculated: 283 mOsm/kg (ref 275–300)
Potassium: 4.5 mMol/L (ref 3.5–5.3)
Protein, Total: 7.2 gm/dL (ref 6.0–8.3)
Sodium: 142 mMol/L (ref 136–147)

## 2021-11-21 LAB — LIPID PANEL
Cholesterol: 214 mg/dL — ABNORMAL HIGH (ref 75–199)
Coronary Heart Disease Risk: 3.89
HDL: 55 mg/dL (ref 45–65)
LDL Calculated: 135 mg/dL
Triglycerides: 122 mg/dL (ref 10–150)
VLDL: 24 (ref 0–40)

## 2021-11-21 LAB — HEPATITIS C ANTIBODY: Hepatitis C Antibody: NONREACTIVE

## 2021-11-21 LAB — THYROID STIMULATING HORMONE (TSH), REFLEX ON ABNORMAL TO FREE T4, SERUM: TSH: 1.36 u[IU]/mL (ref 0.40–4.20)

## 2021-11-23 ENCOUNTER — Encounter (INDEPENDENT_AMBULATORY_CARE_PROVIDER_SITE_OTHER): Payer: No Typology Code available for payment source

## 2021-11-23 ENCOUNTER — Encounter (INDEPENDENT_AMBULATORY_CARE_PROVIDER_SITE_OTHER): Payer: Self-pay | Admitting: Family

## 2021-11-23 ENCOUNTER — Ambulatory Visit (INDEPENDENT_AMBULATORY_CARE_PROVIDER_SITE_OTHER): Payer: No Typology Code available for payment source | Admitting: Family

## 2021-11-23 VITALS — BP 132/74 | HR 83 | Temp 97.2°F | Resp 16 | Ht 62.0 in | Wt 149.2 lb

## 2021-11-23 DIAGNOSIS — R002 Palpitations: Secondary | ICD-10-CM

## 2021-11-23 DIAGNOSIS — R42 Dizziness and giddiness: Secondary | ICD-10-CM

## 2021-11-23 DIAGNOSIS — B001 Herpesviral vesicular dermatitis: Secondary | ICD-10-CM

## 2021-11-23 DIAGNOSIS — Z1211 Encounter for screening for malignant neoplasm of colon: Secondary | ICD-10-CM

## 2021-11-23 DIAGNOSIS — R748 Abnormal levels of other serum enzymes: Secondary | ICD-10-CM

## 2021-11-23 DIAGNOSIS — Z1231 Encounter for screening mammogram for malignant neoplasm of breast: Secondary | ICD-10-CM

## 2021-11-23 DIAGNOSIS — K219 Gastro-esophageal reflux disease without esophagitis: Secondary | ICD-10-CM

## 2021-11-23 NOTE — Progress Notes (Unsigned)
Name: Courtney Fischer                          MRN: [id number]  Date of Service: 11/23/2021        CC:   No chief complaint on file.      Subjective:    Patient presented in office today for follow up for palpitations and elevated blood pressure.     11/21/2021 CBC was within normal limits except an elevated MCV of 102.  Her CMP is within normal limits except an elevated fasting glucose of 103, ALT 90, AST 52.  Total cholesterol is 214, triglycerides 122, HDL 55, LDL 135.  Her TSH is normal at 1.36.      The 10-year ASCVD risk score (Arnett DK, et al., 2019) is: 2.5%    Values used to calculate the score:      Age: 56 years      Sex: Female      Is Non-Hispanic African American: No      Diabetic: No      Tobacco smoker: No      Systolic Blood Pressure: 132 mmHg      Is BP treated: No      HDL Cholesterol: 55 mg/dL      Total Cholesterol: 214 mg/dL    She reports that she has had palpitations, occurring twice per week. She notes that she feels like her HR is skipping and she has to sit down because she will feel dizzy and feel like she is going to pass out. Other times she feels like HR is racing and she sits down to calm down. Symptoms can occur with rest and exertion. She reports that her bp has been elevated as well. History of htn in the past that resolved after stopping birth control pills. Other symptoms include shortness of breath and dizziness intermittently. She denies any chest pain, leg swelling, syncope.      BP was 152/82 in office last visit, 2 weeks ago.     06/21/2018 CMP was within normal limits except for glucose 109, ALT 24.  Her A1c was 5.0.  TSH was normal at 1.17.  Total cholesterol 219, triglycerides 137, HDL 57, LDL 138.    She was was started on omeprazole 20 mg daily for GERD in may 2023, well controlled. 11/19/13 h pylori negative. She had previously tried famotidine, and Gas X.     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 She notes that she has large cold sores on her lips when  she has outbreaks. She notes that she has been on this for a couple years, well controlled with medication.     Health Maintenance:  Diet: she eats out 2-3 times per week but does try to eat at home. She does not eat red meat.   Exercise: none   Water: drinks plenty of water.   Caffeine: none, was previously drinking caffeine but stopped after palpitations started.   Sleep: 6-8 hours of sleep per night.      Social:  Lives with: by herself and dog.   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   drugs: none   Tobacco: not a current or former smoker.   ETOH: none     No Known Allergies  No past medical history on file.  Current Outpatient Medications on File Prior to Visit   Medication Sig Dispense Refill    acyclovir (ZOVIRAX) 400 MG  tablet Take 1 tablet (400 mg) by mouth 2 (two) times daily 60 tablet 11    omeprazole (PriLOSEC) 20 MG capsule Take 1 capsule (20 mg) by mouth daily       No current facility-administered medications on file prior to visit.     No past surgical history on file.   Social History     Tobacco Use    Smoking status: Never    Smokeless tobacco: Never   Vaping Use    Vaping Use: Never used   Substance Use Topics    Alcohol use: Never    Drug use: Never       Cervical cancer screening:   Last pap over 10 years ago, no history of abnormal paps in the past. No hysterectomy.   Lmp was late 29s.     Breast cancer screening:   No family history of breast cancer. Mammogram was ordered 2 weeks ago, has not been scheduled yet.     Colorectal cancer screening:   No family history of colon cancer. Denies any stool changes, pain, blood in stool. She has not had colon cancer screening in the past. Cologuard ordered two weeks ago.      Hepatitis C screening:    Hep C antibody nonreactive 11/21/2021.    Osteoporosis screening:   Has not had dexa scan in the past. Postmenopausal.  Declines at this time.     *Immunizations     Immunization History   Administered Date(s) Administered    Tdap  08/30/2014       Shingles: declines     Covid: declines     Influenza:  declines        Objective:    There were no vitals taken for this visit.    Physical Exam     Assessment/Plan:    No diagnosis found.      *Orders as below     No orders of the defined types were placed in this encounter.    Acyclovir decrease dose to once a day and recheck cmp in one month with follow up at that time.     Holter scheduled to be placed today at 130pm  Pap  Dexa  cologuard      Palpitations, dizziness, elevated bp: EKG in office today showed NSR, rate 81, no signs of ischemia. Ordered labs, 7 day holter monitor. She is going to continue to monitor bp at home and we will recheck in two weeks after labs completed.   GERD: well controlled with omeprazole 20 mg daily.   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 Decreasing dose to once daily.   Mammogram ordered for breast cancer screening. Patient provided with phone number to schedule.   Cologuard ordered for colon cancer screening last visit, received but has not completed yet.     Follow up:  2 weeks or call office sooner with any worsening symptoms or concerns.     Johnn Hai, NP

## 2021-11-24 ENCOUNTER — Encounter (INDEPENDENT_AMBULATORY_CARE_PROVIDER_SITE_OTHER): Payer: Self-pay | Admitting: Family

## 2021-11-26 ENCOUNTER — Encounter (INDEPENDENT_AMBULATORY_CARE_PROVIDER_SITE_OTHER): Payer: Self-pay | Admitting: Family

## 2022-01-02 ENCOUNTER — Other Ambulatory Visit: Payer: Self-pay

## 2022-01-02 ENCOUNTER — Ambulatory Visit (INDEPENDENT_AMBULATORY_CARE_PROVIDER_SITE_OTHER): Payer: 59 | Admitting: Family Medicine

## 2022-01-02 ENCOUNTER — Encounter (INDEPENDENT_AMBULATORY_CARE_PROVIDER_SITE_OTHER): Payer: Self-pay | Admitting: Family Medicine

## 2022-01-02 VITALS — BP 147/91 | HR 100 | Temp 100.0°F | Resp 20 | Wt 150.0 lb

## 2022-01-02 DIAGNOSIS — R519 Headache, unspecified: Secondary | ICD-10-CM

## 2022-01-02 DIAGNOSIS — J069 Acute upper respiratory infection, unspecified: Secondary | ICD-10-CM

## 2022-01-02 LAB — PERFORM POC COVID-19, FLU A/B, RSV RAPID BY PCR - CLINIC ONLY
COVID-19 PCR: NOT DETECTED
Flu A, B, RSV by PCR: NOT DETECTED

## 2022-01-02 NOTE — Nursing Note (Signed)
Chief Complaint:   Chief Complaint              Fever     Congestion     Headache     Generalized Body Aches           Functional Health Screen  Functional Health Screening:   Patient is under 18: No  Have you had a recent unexplained weight loss or gain?: No  Because we are aware of abuse and domestic violence today, we ask all patients: Are you being hurt, hit, or frightened by anyone at your home or in your life?: No  Do you have any basic needs within your home that are not being met? (such as Food, Shelter, Games developer, Transportation): No  Patient is under 18 and therefore has no Advance Directives: No  Patient has: No Advance  Patient has Advance Directive: No  Patient offered: Accepted Packet  Screening unable to be completed: No       BP (!) 147/91   Pulse 100   Temp 37.8 C (100 F) (Tympanic)   Resp 20   Wt 68 kg (150 lb)   LMP 04/12/2012   SpO2 99%   BMI 27.44 kg/m       Social History     Tobacco Use   Smoking Status Never   Smokeless Tobacco Never     Patient Health Rating           Depression Screening  PHQ Questionnaire     Allergies:  No Known Allergies  Medication History  Reviewed for OTC medication and any new medications, provider will review medication history  Results through Enter/Edit  No results found for this or any previous visit (from the past 24 hour(s)).  POCT Results  Care Team  Patient Care Team:  Kayleen Memos, MD as PCP - General ([place])  Immunizations - last 24 hours       None          Darrin Nipper, RN  01/02/2022, 11:20

## 2022-01-02 NOTE — Progress Notes (Signed)
URGENT CARE, [facility]  [address]  [place] [address]       Name: Marie Gutierrez MRN:  [id number]   Date: 01/02/2022 Age: 56 y.o.       Chief Complaint: Fever, Congestion, Headache, and Generalized Body Aches      SUBJECTIVE:   Marie Gutierrez is a 56 y.o. female who complains of sinus and nasal congestion, nasal blockage, post nasal drip, headache, and dry cough for 1 days. Symptoms have been unchanged since that time. She denies a history of wheezing and shortness of breath.     Social History     Tobacco Use   Smoking Status Never   Smokeless Tobacco Never       No Known Allergies      OBJECTIVE:  BP (!) 147/91   Pulse 100   Temp 37.8 C (100 F) (Tympanic)   Resp 20   Wt 68 kg (150 lb)   LMP 04/12/2012   SpO2 99%   BMI 27.44 kg/m       Appearance: in no apparent distress.   ENT- ENT exam normal, no neck nodes or sinus tenderness and post nasal drip noted.   Chest - chest clear, no wheezing, rales, normal symmetric air entry, Heart exam - S1, S2 normal, no murmur, no gallop, rate regular.    ASSESSMENT/PLAN  Assessment/Plan   1. Upper respiratory tract infection, unspecified type        No orders of the defined types were placed in this encounter.      COVID testing obtained.  Recommend quarantine.    Symptomatic therapy suggested: push fluids, rest, use acetaminophen prn, and ROV prn if symptoms persist or worsen.     Plan was discussed and patient/parent verbalized understanding.  If symptoms are not improving within the next couple of days,  advised patient/parent  to followup for further evaluation. Go to Emergency Department immediately for further work up if worsening symptoms or other medical concerns.        Sunday Corn, MD 01/02/2022, 11:44    Portions of this note may be dictated using voice recognition software . Variances in spelling and vocabulary are possible and unintentional. Not all errors are caught/corrected. Please notify the Thereasa Parkin if any  discrepancies are noted or if the meaning of any statement is not clear

## 2022-01-03 LAB — POC COVID-19, FLU A/B, RSV RAPID BY PCR (RESULTS)
INFLUENZA VIRUS A, PCR 4PLEX, POC: NEGATIVE
INFLUENZA VIRUS B, PCR 4PLEX, POC: NEGATIVE
RSV, PCR 4PLEX, POC: NEGATIVE
SARS-COV-2, POC: NEGATIVE

## 2022-01-08 ENCOUNTER — Encounter (INDEPENDENT_AMBULATORY_CARE_PROVIDER_SITE_OTHER): Payer: 59 | Admitting: Geriatric Medicine
